# Patient Record
Sex: Female | Born: 1960 | Race: White | Hispanic: No | Marital: Married | Smoking: Never smoker
Health system: Southern US, Community
[De-identification: ages and names within clinical notes are randomized; demographics above are authoritative.]

## PROBLEM LIST (undated history)

## (undated) DIAGNOSIS — R7303 Prediabetes: Secondary | ICD-10-CM

## (undated) DIAGNOSIS — M1990 Unspecified osteoarthritis, unspecified site: Secondary | ICD-10-CM

## (undated) DIAGNOSIS — F329 Major depressive disorder, single episode, unspecified: Secondary | ICD-10-CM

## (undated) DIAGNOSIS — I10 Essential (primary) hypertension: Secondary | ICD-10-CM

## (undated) DIAGNOSIS — F32A Depression, unspecified: Secondary | ICD-10-CM

## (undated) DIAGNOSIS — R51 Headache: Secondary | ICD-10-CM

## (undated) HISTORY — PX: KNEE ARTHROSCOPY: SUR90

## (undated) HISTORY — PX: COLONOSCOPY: SHX174

---

## 1984-04-30 HISTORY — PX: CHOLECYSTECTOMY: SHX55

## 1999-02-16 ENCOUNTER — Other Ambulatory Visit: Admission: RE | Admit: 1999-02-16 | Discharge: 1999-02-16 | Payer: Self-pay | Admitting: *Deleted

## 2000-05-15 ENCOUNTER — Other Ambulatory Visit: Admission: RE | Admit: 2000-05-15 | Discharge: 2000-05-15 | Payer: Self-pay | Admitting: *Deleted

## 2001-06-10 ENCOUNTER — Other Ambulatory Visit: Admission: RE | Admit: 2001-06-10 | Discharge: 2001-06-10 | Payer: Self-pay | Admitting: *Deleted

## 2002-08-10 ENCOUNTER — Other Ambulatory Visit: Admission: RE | Admit: 2002-08-10 | Discharge: 2002-08-10 | Payer: Self-pay | Admitting: *Deleted

## 2003-06-11 ENCOUNTER — Ambulatory Visit (HOSPITAL_COMMUNITY): Admission: RE | Admit: 2003-06-11 | Discharge: 2003-06-11 | Payer: Self-pay | Admitting: Family Medicine

## 2003-06-29 ENCOUNTER — Other Ambulatory Visit: Admission: RE | Admit: 2003-06-29 | Discharge: 2003-06-29 | Payer: Self-pay | Admitting: *Deleted

## 2004-06-19 ENCOUNTER — Ambulatory Visit (HOSPITAL_COMMUNITY): Admission: RE | Admit: 2004-06-19 | Discharge: 2004-06-19 | Payer: Self-pay | Admitting: Family Medicine

## 2004-06-23 ENCOUNTER — Ambulatory Visit (HOSPITAL_COMMUNITY): Admission: RE | Admit: 2004-06-23 | Discharge: 2004-06-23 | Payer: Self-pay | Admitting: Family Medicine

## 2004-08-01 ENCOUNTER — Other Ambulatory Visit: Admission: RE | Admit: 2004-08-01 | Discharge: 2004-08-01 | Payer: Self-pay | Admitting: *Deleted

## 2007-08-15 ENCOUNTER — Emergency Department (HOSPITAL_COMMUNITY): Admission: EM | Admit: 2007-08-15 | Discharge: 2007-08-15 | Payer: Self-pay | Admitting: Emergency Medicine

## 2008-10-06 ENCOUNTER — Other Ambulatory Visit: Admission: RE | Admit: 2008-10-06 | Discharge: 2008-10-06 | Payer: Self-pay | Admitting: Obstetrics and Gynecology

## 2012-11-05 ENCOUNTER — Other Ambulatory Visit: Payer: Self-pay | Admitting: Orthopedic Surgery

## 2012-11-07 ENCOUNTER — Encounter (HOSPITAL_BASED_OUTPATIENT_CLINIC_OR_DEPARTMENT_OTHER): Payer: Self-pay | Admitting: *Deleted

## 2012-11-07 NOTE — Progress Notes (Signed)
To go to [id number] in a skilled nursing facility

## 2012-11-11 ENCOUNTER — Ambulatory Visit (HOSPITAL_COMMUNITY)
Admission: RE | Admit: 2012-11-11 | Discharge: 2012-11-12 | Disposition: A | Discharge status: Home / Self Care | Payer: PRIVATE HEALTH INSURANCE | Source: Ambulatory Visit | Attending: Orthopedic Surgery | Admitting: Orthopedic Surgery

## 2012-11-11 ENCOUNTER — Encounter (HOSPITAL_COMMUNITY)
Admission: RE | Admit: 2012-11-11 | Discharge: 2012-11-11 | Disposition: A | Discharge status: Home / Self Care | Payer: PRIVATE HEALTH INSURANCE | Source: Ambulatory Visit | Attending: Orthopedic Surgery | Admitting: Orthopedic Surgery

## 2012-11-11 DIAGNOSIS — F329 Major depressive disorder, single episode, unspecified: Secondary | ICD-10-CM | POA: Insufficient documentation

## 2012-11-11 DIAGNOSIS — I10 Essential (primary) hypertension: Secondary | ICD-10-CM | POA: Insufficient documentation

## 2012-11-11 DIAGNOSIS — M23349 Other meniscus derangements, anterior horn of lateral meniscus, unspecified knee: Secondary | ICD-10-CM | POA: Insufficient documentation

## 2012-11-11 DIAGNOSIS — M2240 Chondromalacia patellae, unspecified knee: Secondary | ICD-10-CM | POA: Insufficient documentation

## 2012-11-11 DIAGNOSIS — M23329 Other meniscus derangements, posterior horn of medial meniscus, unspecified knee: Secondary | ICD-10-CM | POA: Insufficient documentation

## 2012-11-11 DIAGNOSIS — Z79899 Other long term (current) drug therapy: Secondary | ICD-10-CM | POA: Insufficient documentation

## 2012-11-11 DIAGNOSIS — F3289 Other specified depressive episodes: Secondary | ICD-10-CM | POA: Insufficient documentation

## 2012-11-11 DIAGNOSIS — M129 Arthropathy, unspecified: Secondary | ICD-10-CM | POA: Insufficient documentation

## 2012-11-11 DIAGNOSIS — M2340 Loose body in knee, unspecified knee: Secondary | ICD-10-CM | POA: Insufficient documentation

## 2012-11-11 DIAGNOSIS — K219 Gastro-esophageal reflux disease without esophagitis: Secondary | ICD-10-CM | POA: Insufficient documentation

## 2012-11-11 HISTORY — DX: Headache: R51

## 2012-11-11 HISTORY — DX: Major depressive disorder, single episode, unspecified: F32.9

## 2012-11-11 HISTORY — DX: Unspecified osteoarthritis, unspecified site: M19.90

## 2012-11-11 HISTORY — DX: Depression, unspecified: F32.A

## 2012-11-11 HISTORY — DX: Essential (primary) hypertension: I10

## 2012-11-11 LAB — BASIC METABOLIC PANEL
CO2: 30 mEq/L (ref 19–32)
Chloride: 103 mEq/L (ref 96–112)
Sodium: 140 mEq/L (ref 135–145)

## 2012-11-12 ENCOUNTER — Encounter (HOSPITAL_BASED_OUTPATIENT_CLINIC_OR_DEPARTMENT_OTHER): Payer: Self-pay | Admitting: *Deleted

## 2012-11-12 ENCOUNTER — Encounter (HOSPITAL_BASED_OUTPATIENT_CLINIC_OR_DEPARTMENT_OTHER)
Admission: RE | Disposition: A | Discharge status: Home / Self Care | Payer: Self-pay | Source: Ambulatory Visit | Attending: Orthopedic Surgery

## 2012-11-12 ENCOUNTER — Ambulatory Visit (HOSPITAL_BASED_OUTPATIENT_CLINIC_OR_DEPARTMENT_OTHER): Payer: PRIVATE HEALTH INSURANCE | Admitting: *Deleted

## 2012-11-12 HISTORY — PX: KNEE ARTHROSCOPY: SHX127

## 2012-11-12 SURGERY — ARTHROSCOPY, KNEE
Anesthesia: General | Site: Knee | Laterality: Left | Wound class: Clean

## 2012-11-12 MED ORDER — ONDANSETRON HCL 4 MG/2ML IJ SOLN: 4.0000 mg | Freq: Once | INTRAMUSCULAR | Status: DC | PRN

## 2012-11-12 MED ORDER — OXYCODONE HCL 5 MG/5ML PO SOLN: 5.0000 mg | Freq: Once | ORAL | Status: AC | PRN

## 2012-11-12 MED ORDER — BUPIVACAINE HCL (PF) 0.5 % IJ SOLN
INTRAMUSCULAR | Status: DC | PRN
  Administered 2012-11-12: 20 mL

## 2012-11-12 MED ORDER — ONDANSETRON HCL 4 MG/2ML IJ SOLN
INTRAMUSCULAR | Status: DC | PRN
  Administered 2012-11-12: 4 mg via INTRAVENOUS

## 2012-11-12 MED ORDER — MEPERIDINE HCL 25 MG/ML IJ SOLN: 6.2500 mg | INTRAMUSCULAR | Status: DC | PRN

## 2012-11-12 MED ORDER — DEXAMETHASONE SODIUM PHOSPHATE 4 MG/ML IJ SOLN
INTRAMUSCULAR | Status: DC | PRN
  Administered 2012-11-12: 10 mg via INTRAVENOUS

## 2012-11-12 MED ORDER — EPINEPHRINE HCL 1 MG/ML IJ SOLN
INTRAMUSCULAR | Status: DC | PRN
  Administered 2012-11-12: 1 mg

## 2012-11-12 MED ORDER — EPHEDRINE SULFATE 50 MG/ML IJ SOLN
INTRAMUSCULAR | Status: DC | PRN
  Administered 2012-11-12: 10 mg via INTRAVENOUS

## 2012-11-12 MED ORDER — HYDROMORPHONE HCL PF 1 MG/ML IJ SOLN: 0.2500 mg | INTRAMUSCULAR | Status: DC | PRN

## 2012-11-12 MED ORDER — PROPOFOL 10 MG/ML IV BOLUS
INTRAVENOUS | Status: DC | PRN
  Administered 2012-11-12: 160 mg via INTRAVENOUS

## 2012-11-12 MED ORDER — POVIDONE-IODINE 7.5 % EX SOLN: Freq: Once | CUTANEOUS | Status: DC

## 2012-11-12 MED ORDER — OXYCODONE HCL 5 MG PO TABS
5.0000 mg | ORAL_TABLET | Freq: Once | ORAL | Status: AC | PRN
  Administered 2012-11-12: 5 mg via ORAL

## 2012-11-12 MED ORDER — LIDOCAINE HCL (CARDIAC) 20 MG/ML IV SOLN
INTRAVENOUS | Status: DC | PRN
  Administered 2012-11-12: 60 mg via INTRAVENOUS

## 2012-11-12 MED ORDER — FENTANYL CITRATE 0.05 MG/ML IJ SOLN
INTRAMUSCULAR | Status: DC | PRN
  Administered 2012-11-12: 25 ug via INTRAVENOUS
  Administered 2012-11-12: 75 ug via INTRAVENOUS

## 2012-11-12 MED ORDER — SODIUM CHLORIDE 0.9 % IR SOLN
Status: DC | PRN
  Administered 2012-11-12: 600 mL

## 2012-11-12 MED ORDER — OXYCODONE-ACETAMINOPHEN 5-325 MG PO TABS: 1.0000 | ORAL_TABLET | Freq: Four times a day (QID) | ORAL | Status: DC | PRN

## 2012-11-12 MED ORDER — MIDAZOLAM HCL 5 MG/5ML IJ SOLN
INTRAMUSCULAR | Status: DC | PRN
  Administered 2012-11-12: 2 mg via INTRAVENOUS

## 2012-11-12 MED ORDER — CEFAZOLIN SODIUM-DEXTROSE 2-3 GM-% IV SOLR
2.0000 g | INTRAVENOUS | Status: AC
  Administered 2012-11-12: 2 g via INTRAVENOUS

## 2012-11-12 MED ORDER — LACTATED RINGERS IV SOLN
INTRAVENOUS | Status: DC
  Administered 2012-11-12 (×2): via INTRAVENOUS

## 2012-11-12 SURGICAL SUPPLY — 40 items
BANDAGE ELASTIC 6 VELCRO ST LF (GAUZE/BANDAGES/DRESSINGS) ×2 IMPLANT
BLADE 4.2CUDA (BLADE) IMPLANT
BLADE GREAT WHITE 4.2 (BLADE) ×2 IMPLANT
CANISTER OMNI JUG 16 LITER (MISCELLANEOUS) ×2 IMPLANT
CANISTER SUCTION 2500CC (MISCELLANEOUS) IMPLANT
CLOTH BEACON ORANGE TIMEOUT ST (SAFETY) ×2 IMPLANT
CUTTER MENISCUS  4.2MM (BLADE)
CUTTER MENISCUS 4.2MM (BLADE) IMPLANT
DRAPE ARTHROSCOPY W/POUCH 114 (DRAPES) ×2 IMPLANT
DRSG EMULSION OIL 3X3 NADH (GAUZE/BANDAGES/DRESSINGS) ×2 IMPLANT
DURAPREP 26ML APPLICATOR (WOUND CARE) ×2 IMPLANT
ELECT MENISCUS 165MM 90D (ELECTRODE) IMPLANT
ELECT REM PT RETURN 9FT ADLT (ELECTROSURGICAL)
ELECTRODE REM PT RTRN 9FT ADLT (ELECTROSURGICAL) IMPLANT
GLOVE BIO SURGEON STRL SZ 6.5 (GLOVE) ×2 IMPLANT
GLOVE BIOGEL PI IND STRL 7.0 (GLOVE) ×1 IMPLANT
GLOVE BIOGEL PI IND STRL 8 (GLOVE) ×2 IMPLANT
GLOVE BIOGEL PI INDICATOR 7.0 (GLOVE) ×1
GLOVE BIOGEL PI INDICATOR 8 (GLOVE) ×2
GLOVE ECLIPSE 7.5 STRL STRAW (GLOVE) ×4 IMPLANT
GOWN BRE IMP PREV XXLGXLNG (GOWN DISPOSABLE) ×2 IMPLANT
GOWN PREVENTION PLUS XLARGE (GOWN DISPOSABLE) ×2 IMPLANT
GOWN PREVENTION PLUS XXLARGE (GOWN DISPOSABLE) ×2 IMPLANT
HOLDER KNEE FOAM BLUE (MISCELLANEOUS) ×2 IMPLANT
KNEE WRAP E Z 3 GEL PACK (MISCELLANEOUS) ×2 IMPLANT
NDL SAFETY ECLIPSE 18X1.5 (NEEDLE) IMPLANT
NEEDLE HYPO 18GX1.5 SHARP (NEEDLE)
PACK ARTHROSCOPY DSU (CUSTOM PROCEDURE TRAY) ×2 IMPLANT
PACK BASIN DAY SURGERY FS (CUSTOM PROCEDURE TRAY) ×2 IMPLANT
PAD CAST 4YDX4 CTTN HI CHSV (CAST SUPPLIES) ×1 IMPLANT
PADDING CAST COTTON 4X4 STRL (CAST SUPPLIES) ×1
PENCIL BUTTON HOLSTER BLD 10FT (ELECTRODE) IMPLANT
SET ARTHROSCOPY TUBING (MISCELLANEOUS) ×1
SET ARTHROSCOPY TUBING LN (MISCELLANEOUS) ×1 IMPLANT
SPONGE GAUZE 4X4 12PLY (GAUZE/BANDAGES/DRESSINGS) ×2 IMPLANT
SUT ETHILON 4 0 PS 2 18 (SUTURE) IMPLANT
SYR 5ML LL (SYRINGE) ×2 IMPLANT
TOWEL OR 17X24 6PK STRL BLUE (TOWEL DISPOSABLE) ×2 IMPLANT
TOWEL OR NON WOVEN STRL DISP B (DISPOSABLE) IMPLANT
WATER STERILE IRR 1000ML POUR (IV SOLUTION) ×2 IMPLANT

## 2012-11-12 NOTE — Transfer of Care (Signed)
Immediate Anesthesia Transfer of Care Note  Patient: Margaret Gonzalez  Procedure(s) Performed: Procedure(s) with comments: ARTHROSCOPY KNEE  (Left) - partial medial and lateral menisectomies and chrondroplasty of patella  Patient Location: PACU  Anesthesia Type:General  Level of Consciousness: awake, alert  and oriented  Airway & Oxygen Therapy: Patient Spontanous Breathing and Patient connected to face mask oxygen  Post-op Assessment: Report given to PACU RN, Post -op Vital signs reviewed and stable and Patient moving all extremities  Post vital signs: Reviewed and stable  Complications: No apparent anesthesia complications

## 2012-11-12 NOTE — Brief Op Note (Signed)
11/12/2012  12:51 PM  PATIENT:  Margaret Gonzalez  52 y.o. female  PRE-OPERATIVE DIAGNOSIS:  MEDIAL MENISCAL TEAR LEFT KNEE  POST-OPERATIVE DIAGNOSIS:  MEDIAL MENISCAL TEAR LEFT KNEE  PROCEDURE:  Procedure(s) with comments: ARTHROSCOPY KNEE  (Left) - partial medial and lateral menisectomies and chrondroplasty of patella  SURGEON:  Surgeon(s) and Role:    * Harvie Junior, MD - Primary  PHYSICIAN ASSISTANT:   ASSISTANTS: bethune   ANESTHESIA:   general  EBL:  Total I/O In: 1000 [I.V.:1000] Out: -   BLOOD ADMINISTERED:none  DRAINS: none   LOCAL MEDICATIONS USED:  MARCAINE     SPECIMEN:  No Specimen  DISPOSITION OF SPECIMEN:  N/A  COUNTS:  YES  TOURNIQUET:  * No tourniquets in log *  DICTATION: .Other Dictation: Dictation Number dictated but mised number  PLAN OF CARE: Discharge to home after PACU  PATIENT DISPOSITION:  PACU - hemodynamically stable.   Delay start of Pharmacological VTE agent (>24hrs) due to surgical blood loss or risk of bleeding: no

## 2012-11-12 NOTE — Anesthesia Postprocedure Evaluation (Signed)
Anesthesia Post Note  Patient: Margaret Gonzalez  Procedure(s) Performed: Procedure(s) (LRB): ARTHROSCOPY KNEE  (Left)  Anesthesia type: general  Patient location: PACU  Post pain: Pain level controlled  Post assessment: Patient's Cardiovascular Status Stable  Last Vitals:  Filed Vitals:   11/12/12 1358  BP: 156/88  Pulse: 67  Temp: 36.6 C  Resp: 16    Post vital signs: Reviewed and stable  Level of consciousness: sedated  Complications: No apparent anesthesia complications

## 2012-11-12 NOTE — Anesthesia Procedure Notes (Signed)
Procedure Name: LMA Insertion Date/Time: 11/12/2012 12:12 PM Performed by: Suann Larry WOLFE Pre-anesthesia Checklist: Patient identified, Emergency Drugs available, Suction available and Patient being monitored Patient Re-evaluated:Patient Re-evaluated prior to inductionOxygen Delivery Method: Circle System Utilized Preoxygenation: Pre-oxygenation with 100% oxygen Intubation Type: IV induction Ventilation: Mask ventilation without difficulty LMA: LMA inserted LMA Size: 4.0 Number of attempts: 1 Airway Equipment and Method: bite block Placement Confirmation: positive ETCO2 and breath sounds checked- equal and bilateral Tube secured with: Tape Dental Injury: Teeth and Oropharynx as per pre-operative assessment

## 2012-11-12 NOTE — Anesthesia Preprocedure Evaluation (Signed)
Anesthesia Evaluation  Patient identified by MRN, date of birth, ID band Patient awake    Reviewed: Allergy & Precautions, H&P , NPO status , Patient's Chart, lab work & pertinent test results  Airway Mallampati: I TM Distance: >3 FB Neck ROM: Full    Dental   Pulmonary          Cardiovascular hypertension, Pt. on medications     Neuro/Psych Depression    GI/Hepatic GERD-  Medicated and Controlled,  Endo/Other    Renal/GU      Musculoskeletal   Abdominal   Peds  Hematology   Anesthesia Other Findings   Reproductive/Obstetrics                           Anesthesia Physical Anesthesia Plan  ASA: II  Anesthesia Plan: General   Post-op Pain Management:    Induction: Intravenous  Airway Management Planned: LMA  Additional Equipment:   Intra-op Plan:   Post-operative Plan: Extubation in OR  Informed Consent: I have reviewed the patients History and Physical, chart, labs and discussed the procedure including the risks, benefits and alternatives for the proposed anesthesia with the patient or authorized representative who has indicated his/her understanding and acceptance.     Plan Discussed with: CRNA and Surgeon  Anesthesia Plan Comments:         Anesthesia Quick Evaluation

## 2012-11-12 NOTE — H&P (Signed)
  PREOPERATIVE H&P  Chief Complaint: l knee pain  HPI: Margaret Gonzalez is a 52 y.o. female who presents for evaluation of l knee pain. It has been present for greater than 3 months and has been worsening. She has failed conservative measures. Pain is rated as moderate.  Past Medical History  Diagnosis Date  . Hypertension   . Arthritis   . Headache(784.0)   . Depression    Past Surgical History  Procedure Laterality Date  . Cholecystectomy  1986  . Colonoscopy    . Cesarean section  [phone]    x3   History   Social History  . Marital Status: Married    Spouse Name: N/A    Number of Children: N/A  . Years of Education: N/A   Social History Main Topics  . Smoking status: Never Smoker   . Smokeless tobacco: None  . Alcohol Use: No  . Drug Use: No  . Sexually Active: None   Other Topics Concern  . None   Social History Narrative  . None   History reviewed. No pertinent family history. No Known Allergies Prior to Admission medications   Medication Sig Start Date End Date Taking? Authorizing Provider  citalopram (CELEXA) 40 MG tablet Take 40 mg by mouth daily.   Yes Historical Provider, MD  fish oil-omega-3 fatty acids 1000 MG capsule Take 2 g by mouth daily.   Yes Historical Provider, MD  ibuprofen (ADVIL,MOTRIN) 200 MG tablet Take 200 mg by mouth every 6 (six) hours as needed for pain.   Yes Historical Provider, MD  Multiple Vitamins-Minerals (MULTIVITAMIN WITH MINERALS) tablet Take 1 tablet by mouth daily.   Yes Historical Provider, MD  telmisartan-hydrochlorothiazide (MICARDIS HCT) 80-12.5 MG per tablet Take 1 tablet by mouth daily.   Yes Historical Provider, MD     Positive ROS: none  All other systems have been reviewed and were otherwise negative with the exception of those mentioned in the HPI and as above.  Physical Exam: Filed Vitals:   11/12/12 1130  BP: 142/90  Pulse:   Temp:   Resp:     General: Alert, no acute distress Cardiovascular: No  pedal edema Respiratory: No cyanosis, no use of accessory musculature GI: No organomegaly, abdomen is soft and non-tender Skin: No lesions in the area of chief complaint Neurologic: Sensation intact distally Psychiatric: Patient is competent for consent with normal mood and affect Lymphatic: No axillary or cervical lymphadenopathy  MUSCULOSKELETAL: L. Knee:  + mcmurray, -lochman,-pivot shift   Assessment/Plan: MEDIAL MENISCAL TEAR LEFT KNEE Plan for Procedure(s): ARTHROSCOPY KNEE  The risks benefits and alternatives were discussed with the patient including but not limited to the risks of nonoperative treatment, versus surgical intervention including infection, bleeding, nerve injury, malunion, nonunion, hardware prominence, hardware failure, need for hardware removal, blood clots, cardiopulmonary complications, morbidity, mortality, among others, and they were willing to proceed.  Predicted outcome is good, although there will be at least a six to nine month expected recovery.  Margaret Gonzalez L, MD 11/12/2012 11:31 AM

## 2012-11-13 ENCOUNTER — Encounter (HOSPITAL_BASED_OUTPATIENT_CLINIC_OR_DEPARTMENT_OTHER): Payer: Self-pay | Admitting: Orthopedic Surgery

## 2012-11-13 NOTE — Op Note (Signed)
NAMEKISSIE, ZIOLKOWSKI               ACCOUNT NO.:  [id number]  MEDICAL RECORD NO.:  [id number]  LOCATION:                               FACILITY:  [facility]  PHYSICIAN:  Harvie Junior, M.D.   DATE OF BIRTH:  09-29-1960  DATE OF PROCEDURE:  11/12/2012 DATE OF DISCHARGE:  11/12/2012                              OPERATIVE REPORT   PREOPERATIVE DIAGNOSIS:  Medial meniscal tear.  POSTOPERATIVE DIAGNOSES: 1. Medial meniscal tear. 2. Lateral meniscal tear, anterior horn. 3. Chondromalacia of patella. 4. Cartilaginous loose bodies.  PRINCIPAL PROCEDURES: 1. Arthroscopic medial and lateral partial meniscectomy with     corresponding debridement of medial and lateral compartments. 2. Chondroplasty of patellofemoral joint. 3. Removal of cartilaginous loose body.  SURGEON:  Harvie Junior, M.D.  ASSISTANT:  Marshia Ly, PA.  ANESTHESIA:  General.  BRIEF HISTORY:  Mrs. Parkison is a 52 year old female with a long history of having had significant complaints of left knee pain.  She had been treated conservatively for period of time.  MRI was obtained, which showed she had a posterior horn medial meniscal tear and tricompartmental DJD.  She after failure of all conservative care was taken to the operating room for arthroscopic evaluation.  PROCEDURE:  The patient was taken to the operating room.  After adequate level of anesthesia was obtained with general anesthesia, the patient was placed supine on the operating table.  The left leg was prepped and draped in usual sterile fashion.  Following this, a routine arthroscopic examination of the knee revealed there was obvious chondromalacia of the patellofemoral joint, which debrided back to a smooth and stable rim. Attention was then turned into the medial compartment where there was fairly significant degenerative change.  There was a posterior horn of medial meniscal tear, which was debrided.  ACL was normal.  Attention was turned into the  lateral compartment with anterior horn was torn and this was debrided back to a smooth and stable rim.  Lateral and femoral condyle were debrided minimally and once this was completed, attention was turned towards the medial side.  In the medial compartment, we encountered a large cartilaginous loose body, which was removed.  At this point, the knee was copiously and thoroughly lavaged and suctioned dry. The arthroscopic portal was closed with bandage.  Sterile compressive dressing was applied.  The patient was taken to the recovery room, she was noted to be in satisfactory condition.  Estimated blood loss for the procedure was none.     Harvie Junior, M.D.     Ranae Plumber  D:  11/12/2012  T:  11/13/2012  Job:  [id number]

## 2014-01-25 ENCOUNTER — Other Ambulatory Visit: Payer: Self-pay | Admitting: Gastroenterology

## 2014-01-25 DIAGNOSIS — R160 Hepatomegaly, not elsewhere classified: Secondary | ICD-10-CM

## 2014-02-02 ENCOUNTER — Ambulatory Visit
Admission: RE | Admit: 2014-02-02 | Discharge: 2014-02-02 | Disposition: A | Discharge status: Home / Self Care | Payer: PRIVATE HEALTH INSURANCE | Source: Ambulatory Visit | Attending: Gastroenterology | Admitting: Gastroenterology

## 2014-02-02 DIAGNOSIS — R160 Hepatomegaly, not elsewhere classified: Secondary | ICD-10-CM

## 2014-02-02 MED ORDER — GADOXETATE DISODIUM 0.25 MMOL/ML IV SOLN
10.0000 mL | Freq: Once | INTRAVENOUS | Status: AC | PRN
  Administered 2014-02-02: 10 mL via INTRAVENOUS

## 2014-02-08 ENCOUNTER — Other Ambulatory Visit (HOSPITAL_COMMUNITY): Payer: Self-pay | Admitting: Gastroenterology

## 2014-02-08 DIAGNOSIS — R160 Hepatomegaly, not elsewhere classified: Secondary | ICD-10-CM

## 2014-02-23 ENCOUNTER — Ambulatory Visit (HOSPITAL_COMMUNITY): Payer: PRIVATE HEALTH INSURANCE

## 2014-03-02 ENCOUNTER — Ambulatory Visit (HOSPITAL_COMMUNITY): Payer: PRIVATE HEALTH INSURANCE

## 2014-03-29 ENCOUNTER — Ambulatory Visit (HOSPITAL_COMMUNITY): Payer: PRIVATE HEALTH INSURANCE

## 2014-04-01 ENCOUNTER — Ambulatory Visit (HOSPITAL_COMMUNITY)
Admission: RE | Admit: 2014-04-01 | Discharge: 2014-04-01 | Disposition: A | Discharge status: Home / Self Care | Payer: PRIVATE HEALTH INSURANCE | Source: Ambulatory Visit | Attending: Gastroenterology | Admitting: Gastroenterology

## 2014-04-01 DIAGNOSIS — R160 Hepatomegaly, not elsewhere classified: Secondary | ICD-10-CM

## 2014-04-01 DIAGNOSIS — K760 Fatty (change of) liver, not elsewhere classified: Secondary | ICD-10-CM | POA: Insufficient documentation

## 2014-04-01 DIAGNOSIS — I10 Essential (primary) hypertension: Secondary | ICD-10-CM | POA: Insufficient documentation

## 2014-04-01 DIAGNOSIS — Z9049 Acquired absence of other specified parts of digestive tract: Secondary | ICD-10-CM | POA: Diagnosis not present

## 2014-04-14 ENCOUNTER — Ambulatory Visit
Admission: RE | Admit: 2014-04-14 | Discharge: 2014-04-14 | Disposition: A | Discharge status: Home / Self Care | Payer: PRIVATE HEALTH INSURANCE | Source: Ambulatory Visit | Attending: Family Medicine | Admitting: Family Medicine

## 2014-04-14 ENCOUNTER — Other Ambulatory Visit: Payer: Self-pay | Admitting: Family Medicine

## 2014-04-14 DIAGNOSIS — R1031 Right lower quadrant pain: Secondary | ICD-10-CM

## 2014-04-14 MED ORDER — IOHEXOL 300 MG/ML  SOLN
125.0000 mL | Freq: Once | INTRAMUSCULAR | Status: AC | PRN
  Administered 2014-04-14: 125 mL via INTRAVENOUS

## 2015-01-04 ENCOUNTER — Other Ambulatory Visit: Payer: Self-pay | Admitting: Family Medicine

## 2015-01-04 ENCOUNTER — Ambulatory Visit
Admission: RE | Admit: 2015-01-04 | Discharge: 2015-01-04 | Disposition: A | Discharge status: Home / Self Care | Payer: PRIVATE HEALTH INSURANCE | Source: Ambulatory Visit | Attending: Family Medicine | Admitting: Family Medicine

## 2015-01-04 DIAGNOSIS — M545 Low back pain: Secondary | ICD-10-CM

## 2015-04-04 ENCOUNTER — Other Ambulatory Visit: Payer: Self-pay | Admitting: Obstetrics and Gynecology

## 2015-04-04 ENCOUNTER — Other Ambulatory Visit (HOSPITAL_COMMUNITY)
Admission: RE | Admit: 2015-04-04 | Discharge: 2015-04-04 | Disposition: A | Discharge status: Home / Self Care | Payer: PRIVATE HEALTH INSURANCE | Source: Ambulatory Visit | Attending: Obstetrics and Gynecology | Admitting: Obstetrics and Gynecology

## 2015-04-04 DIAGNOSIS — Z1151 Encounter for screening for human papillomavirus (HPV): Secondary | ICD-10-CM | POA: Diagnosis present

## 2015-04-04 DIAGNOSIS — Z01419 Encounter for gynecological examination (general) (routine) without abnormal findings: Secondary | ICD-10-CM | POA: Insufficient documentation

## 2015-04-06 LAB — CYTOLOGY - PAP

## 2016-05-28 DIAGNOSIS — R7301 Impaired fasting glucose: Secondary | ICD-10-CM | POA: Diagnosis not present

## 2016-05-28 DIAGNOSIS — I10 Essential (primary) hypertension: Secondary | ICD-10-CM | POA: Diagnosis not present

## 2016-05-28 DIAGNOSIS — E7800 Pure hypercholesterolemia, unspecified: Secondary | ICD-10-CM | POA: Diagnosis not present

## 2016-07-11 ENCOUNTER — Encounter: Payer: Self-pay | Admitting: Podiatry

## 2016-07-11 ENCOUNTER — Ambulatory Visit (INDEPENDENT_AMBULATORY_CARE_PROVIDER_SITE_OTHER): Payer: 59 | Admitting: Podiatry

## 2016-07-11 ENCOUNTER — Ambulatory Visit (INDEPENDENT_AMBULATORY_CARE_PROVIDER_SITE_OTHER): Payer: 59

## 2016-07-11 DIAGNOSIS — M722 Plantar fascial fibromatosis: Secondary | ICD-10-CM

## 2016-07-11 MED ORDER — TRIAMCINOLONE ACETONIDE 10 MG/ML IJ SUSP
10.0000 mg | Freq: Once | INTRAMUSCULAR | Status: AC
  Administered 2016-07-11: 10 mg

## 2016-07-11 MED ORDER — DICLOFENAC SODIUM 75 MG PO TBEC: 75.0000 mg | DELAYED_RELEASE_TABLET | Freq: Two times a day (BID) | ORAL | 2 refills | Status: DC

## 2016-07-11 NOTE — Progress Notes (Signed)
   Subjective:    Patient ID: Margaret Gonzalez, female    DOB: 07/04/1960, 56 y.o.   MRN: [id number]  HPI Chief Complaint  Patient presents with  . Foot Pain    Left foot; bottom of heel; pt stated, "Hurts all day long"; x2 weeks   Pt stated, "Pain radiates up the calf"   Review of Systems  Musculoskeletal: Positive for gait problem.  All other systems reviewed and are negative.      Objective:   Physical Exam        Assessment & Plan:

## 2016-07-11 NOTE — Patient Instructions (Signed)

## 2016-07-12 NOTE — Progress Notes (Signed)
Subjective:     Patient ID: Margaret Gonzalez, female   DOB: 12/20/1960, 56 y.o.   MRN: [id number]  HPI patient presents with exquisite discomfort plantar aspect left heel at the insertional point of the tendon into the calcaneus with inflammation fluid buildup around the medial band states it's only been present for around a month   Review of Systems  All other systems reviewed and are negative.      Objective:   Physical Exam  Constitutional: She is oriented to person, place, and time.  Cardiovascular: Intact distal pulses.   Musculoskeletal: Normal range of motion.  Neurological: She is oriented to person, place, and time.  Skin: Skin is warm.  Nursing note and vitals reviewed.  neurovascular status intact muscle strength adequate range of motion within normal limits with patient found to have exquisite discomfort left plantar heel at the insertional point of the tendon into the calcaneus with fluid buildup. Patient does have moderate depression of the arch and good digital perfusion and well oriented 3     Assessment:     Inflammatory fasciitis left heel at the insertional point of the tendon into the calcaneus    Plan:     H&P x-rays reviewed and injected the plantar fascial left 3 mg Kenalog 5 mg Xylocaine and instructed on physical therapy and dispensed fascial brace. Discussed long-term orthotics and placed on anti-inflammatory currently and will see back again in 2 weeks  X-ray indicated small spur with no indications of stress fracture or arthritis

## 2016-07-26 ENCOUNTER — Ambulatory Visit: Payer: 59 | Admitting: Podiatry

## 2016-08-06 ENCOUNTER — Ambulatory Visit (INDEPENDENT_AMBULATORY_CARE_PROVIDER_SITE_OTHER): Payer: 59 | Admitting: Podiatry

## 2016-08-06 DIAGNOSIS — M722 Plantar fascial fibromatosis: Secondary | ICD-10-CM | POA: Diagnosis not present

## 2016-08-06 MED ORDER — TRIAMCINOLONE ACETONIDE 10 MG/ML IJ SUSP
10.0000 mg | Freq: Once | INTRAMUSCULAR | Status: AC
  Administered 2016-08-06: 10 mg

## 2016-08-06 NOTE — Progress Notes (Signed)
Subjective:     Patient ID: Margaret Gonzalez, female   DOB: [birth year], 56 y.o.   MRN: [id number]  HPI patient states she still having quite a bit of pain in the left heel and is had history of right heel pain with pain worse when getting up in the morning and after sitting   Review of Systems     Objective:   Physical Exam Neurovascular status intact with continued significant discomfort in the left plantar fashion at the insertion with pain worse after periods of sitting and when getting up in the morning    Assessment:     Plantar fasciitis left heel intense in nature    Plan:     H&P condition reviewed and at this point I reinjected the plantar fascia 3 mg Kenalog 5 mg Xylocaine and went ahead and applied night splint with all instructions on usage. I then scanned for custom orthotic devices to reduce long-term pain and help with significant structural malalignment

## 2016-08-08 DIAGNOSIS — M25561 Pain in right knee: Secondary | ICD-10-CM | POA: Diagnosis not present

## 2016-08-13 DIAGNOSIS — M25561 Pain in right knee: Secondary | ICD-10-CM | POA: Diagnosis not present

## 2016-08-21 ENCOUNTER — Telehealth: Payer: Self-pay | Admitting: *Deleted

## 2016-08-21 NOTE — Telephone Encounter (Signed)
Orthotics are in.  Left message for patient to call and schedule an appointment to be fitted.

## 2016-09-17 DIAGNOSIS — M25561 Pain in right knee: Secondary | ICD-10-CM | POA: Diagnosis not present

## 2016-09-28 DIAGNOSIS — M25561 Pain in right knee: Secondary | ICD-10-CM | POA: Diagnosis not present

## 2016-10-08 DIAGNOSIS — I10 Essential (primary) hypertension: Secondary | ICD-10-CM | POA: Diagnosis not present

## 2016-10-08 DIAGNOSIS — E7800 Pure hypercholesterolemia, unspecified: Secondary | ICD-10-CM | POA: Diagnosis not present

## 2016-10-11 ENCOUNTER — Ambulatory Visit: Payer: 59 | Admitting: Orthotics

## 2016-10-15 DIAGNOSIS — M23221 Derangement of posterior horn of medial meniscus due to old tear or injury, right knee: Secondary | ICD-10-CM | POA: Diagnosis not present

## 2016-10-15 DIAGNOSIS — M23241 Derangement of anterior horn of lateral meniscus due to old tear or injury, right knee: Secondary | ICD-10-CM | POA: Diagnosis not present

## 2016-10-15 DIAGNOSIS — M94261 Chondromalacia, right knee: Secondary | ICD-10-CM | POA: Diagnosis not present

## 2016-10-15 DIAGNOSIS — M6751 Plica syndrome, right knee: Secondary | ICD-10-CM | POA: Diagnosis not present

## 2016-11-20 ENCOUNTER — Other Ambulatory Visit: Payer: 59 | Admitting: Orthotics

## 2016-11-20 DIAGNOSIS — M25561 Pain in right knee: Secondary | ICD-10-CM | POA: Diagnosis not present

## 2017-02-05 DIAGNOSIS — M25561 Pain in right knee: Secondary | ICD-10-CM | POA: Diagnosis not present

## 2017-05-14 DIAGNOSIS — M1711 Unilateral primary osteoarthritis, right knee: Secondary | ICD-10-CM | POA: Diagnosis not present

## 2017-05-17 DIAGNOSIS — R7303 Prediabetes: Secondary | ICD-10-CM | POA: Diagnosis not present

## 2017-05-17 DIAGNOSIS — E7800 Pure hypercholesterolemia, unspecified: Secondary | ICD-10-CM | POA: Diagnosis not present

## 2017-05-17 DIAGNOSIS — I10 Essential (primary) hypertension: Secondary | ICD-10-CM | POA: Diagnosis not present

## 2017-05-17 DIAGNOSIS — R0981 Nasal congestion: Secondary | ICD-10-CM | POA: Diagnosis not present

## 2019-05-22 ENCOUNTER — Other Ambulatory Visit: Payer: Self-pay

## 2019-05-22 ENCOUNTER — Ambulatory Visit: Payer: PRIVATE HEALTH INSURANCE | Attending: Internal Medicine

## 2019-05-22 DIAGNOSIS — Z20822 Contact with and (suspected) exposure to covid-19: Secondary | ICD-10-CM

## 2019-05-23 LAB — NOVEL CORONAVIRUS, NAA: SARS-CoV-2, NAA: NOT DETECTED

## 2020-06-21 ENCOUNTER — Emergency Department (HOSPITAL_COMMUNITY): Payer: PRIVATE HEALTH INSURANCE

## 2020-06-21 ENCOUNTER — Encounter (HOSPITAL_COMMUNITY): Payer: Self-pay | Admitting: Emergency Medicine

## 2020-06-21 ENCOUNTER — Other Ambulatory Visit: Payer: Self-pay

## 2020-06-21 ENCOUNTER — Emergency Department (HOSPITAL_COMMUNITY)
Admission: EM | Admit: 2020-06-21 | Discharge: 2020-06-21 | Disposition: A | Discharge status: Home / Self Care | Payer: PRIVATE HEALTH INSURANCE | Attending: Emergency Medicine | Admitting: Emergency Medicine

## 2020-06-21 DIAGNOSIS — Z79899 Other long term (current) drug therapy: Secondary | ICD-10-CM | POA: Insufficient documentation

## 2020-06-21 DIAGNOSIS — W19XXXA Unspecified fall, initial encounter: Secondary | ICD-10-CM

## 2020-06-21 DIAGNOSIS — Z7982 Long term (current) use of aspirin: Secondary | ICD-10-CM | POA: Insufficient documentation

## 2020-06-21 DIAGNOSIS — M79602 Pain in left arm: Secondary | ICD-10-CM

## 2020-06-21 DIAGNOSIS — S42322A Displaced transverse fracture of shaft of humerus, left arm, initial encounter for closed fracture: Secondary | ICD-10-CM | POA: Insufficient documentation

## 2020-06-21 DIAGNOSIS — W08XXXA Fall from other furniture, initial encounter: Secondary | ICD-10-CM | POA: Insufficient documentation

## 2020-06-21 DIAGNOSIS — I10 Essential (primary) hypertension: Secondary | ICD-10-CM | POA: Insufficient documentation

## 2020-06-21 LAB — BASIC METABOLIC PANEL
Anion gap: 9 (ref 5–15)
BUN: 16 mg/dL (ref 6–20)
CO2: 27 mmol/L (ref 22–32)
Calcium: 8.8 mg/dL — ABNORMAL LOW (ref 8.9–10.3)
Chloride: 99 mmol/L (ref 98–111)
Creatinine, Ser: 0.67 mg/dL (ref 0.44–1.00)
GFR, Estimated: >60 mL/min (ref >60)
Glucose, Bld: 149 mg/dL — ABNORMAL HIGH (ref 70–99)
Potassium: 3.4 mmol/L — ABNORMAL LOW (ref 3.5–5.1)
Sodium: 135 mmol/L (ref 135–145)

## 2020-06-21 LAB — CBC
HCT: 41.4 % (ref 36.0–46.0)
Hemoglobin: 13.5 g/dL (ref 12.0–15.0)
MCH: 29.2 pg (ref 26.0–34.0)
MCHC: 32.6 g/dL (ref 30.0–36.0)
MCV: 89.6 fL (ref 80.0–100.0)
Platelets: 289 10*3/uL (ref 150–400)
RBC: 4.62 MIL/uL (ref 3.87–5.11)
RDW: 13.7 % (ref 11.5–15.5)
WBC: 12.3 10*3/uL — ABNORMAL HIGH (ref 4.0–10.5)
nRBC: 0 % (ref 0.0–0.2)

## 2020-06-21 MED ORDER — ONDANSETRON 4 MG PO TBDP: 4.0000 mg | ORAL_TABLET | Freq: Three times a day (TID) | ORAL | 0 refills | Status: AC | PRN

## 2020-06-21 MED ORDER — LORAZEPAM 1 MG PO TABS
1.0000 mg | ORAL_TABLET | Freq: Once | ORAL | Status: AC
  Administered 2020-06-21: 1 mg via ORAL
  Filled 2020-06-21: qty 1

## 2020-06-21 MED ORDER — HYDROMORPHONE HCL 1 MG/ML IJ SOLN: 0.5000 mg | Freq: Once | INTRAMUSCULAR | Status: DC

## 2020-06-21 MED ORDER — ONDANSETRON 4 MG PO TBDP: 4.0000 mg | ORAL_TABLET | Freq: Three times a day (TID) | ORAL | 0 refills | Status: DC | PRN

## 2020-06-21 MED ORDER — ONDANSETRON HCL 4 MG/2ML IJ SOLN
4.0000 mg | Freq: Once | INTRAMUSCULAR | Status: AC
  Administered 2020-06-21: 4 mg via INTRAVENOUS
  Filled 2020-06-21: qty 2

## 2020-06-21 MED ORDER — DIPHENHYDRAMINE HCL 50 MG/ML IJ SOLN
12.5000 mg | Freq: Once | INTRAMUSCULAR | Status: AC
  Administered 2020-06-21: 12.5 mg via INTRAVENOUS
  Filled 2020-06-21: qty 1

## 2020-06-21 MED ORDER — METHOCARBAMOL 500 MG PO TABS: 500.0000 mg | ORAL_TABLET | Freq: Two times a day (BID) | ORAL | 0 refills | Status: DC

## 2020-06-21 MED ORDER — FENTANYL CITRATE (PF) 100 MCG/2ML IJ SOLN: 100.0000 ug | INTRAMUSCULAR | Status: DC | PRN

## 2020-06-21 MED ORDER — HYDROCODONE-ACETAMINOPHEN 5-325 MG PO TABS: 1.0000 | ORAL_TABLET | Freq: Four times a day (QID) | ORAL | 0 refills | Status: DC | PRN

## 2020-06-21 MED ORDER — HYDROMORPHONE HCL 1 MG/ML IJ SOLN
0.5000 mg | Freq: Once | INTRAMUSCULAR | Status: AC
  Administered 2020-06-21: 0.5 mg via INTRAVENOUS
  Filled 2020-06-21: qty 1

## 2020-06-21 MED ORDER — HYDROCODONE-ACETAMINOPHEN 5-325 MG PO TABS
1.0000 | ORAL_TABLET | Freq: Once | ORAL | Status: AC
  Administered 2020-06-21: 1 via ORAL
  Filled 2020-06-21: qty 1

## 2020-06-21 NOTE — ED Notes (Signed)
Patient transported to X-ray 

## 2020-06-21 NOTE — ED Notes (Signed)
Entered room and introduced self to patient. Pt appears to be resting in bed, respirations are even and unlabored with equal chest rise and fall. Bed is locked in the lowest position, side rails x2, call bell within reach. Pt educated on call light use and hourly rounding, verbalized understanding and in agreement at this time. All questions and concerns voiced addressed. Refreshments offered and provided per patient request.  Will continue to monitor.   

## 2020-06-21 NOTE — ED Provider Notes (Signed)
[facility] EMERGENCY DEPARTMENT Provider Note   CSN: [id number] Arrival date & time: 06/21/20  1350     History Chief Complaint  Patient presents with  . Fall    Margaret Gonzalez is a 60 y.o. female.  HPI Patient is a 60 year old female with no pertinent past medical history apart from depression, hypertension, borderline diabetes  Patient presented today after a fall that occurred approximately 1.5 hours ago.  She states that she was standing on a barstool fell backwards because she lost her balance and fell to the ground.  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  She states that she did hit her head and fell onto her left shoulder.  She states that she immediately knew something was wrong of her arm.  She states she did not lose consciousness and states that she denies any nausea until she had fentanyl via EMS.  She states that she has not had any vomiting.  She denies any significant headache.  She denies any back pain chest pain or abdominal pain.  She states she still has sensation in her left arm.  She states that she is able to move her fingers but cannot move her arm because of the pain.  She has no history of osteoporosis or cancer.  No other associate symptoms.  No aggravating factors apart from significantly worse with touch or movement.  She received 100 of fentanyl and 4 of Zofran prior to arrival via EMS.  She is not on any blood thinners.    Past Medical History:  Diagnosis Date  . Arthritis   . Depression   . Headache(784.0)   . Hypertension     There are no problems to display for this patient.   Past Surgical History:  Procedure Laterality Date  . CESAREAN SECTION  [phone]   x3  . CHOLECYSTECTOMY  1986  . COLONOSCOPY    . KNEE ARTHROSCOPY Left 11/12/2012   Procedure: ARTHROSCOPY KNEE ;  Surgeon: Harvie Junior, MD;  Location: [facility];  Service: Orthopedics;  Laterality: Left;  partial medial and lateral menisectomies and  chrondroplasty of patella     OB History   No obstetric history on file.     History reviewed. No pertinent family history.  Social History   Tobacco Use  . Smoking status: Never Smoker  . Smokeless tobacco: Never Used  Substance Use Topics  . Alcohol use: No  . Drug use: No    Home Medications Prior to Admission medications   Medication Sig Start Date End Date Taking? Authorizing Provider  albuterol (VENTOLIN HFA) 108 (90 Base) MCG/ACT inhaler Inhale 1 puff into the lungs every 6 (six) hours as needed for wheezing or shortness of breath. 05/16/18  Yes [provider]  amLODipine (NORVASC) 10 MG tablet Take 10 mg by mouth daily. 05/27/20  Yes [provider]  aspirin EC 81 MG tablet Take 81 mg by mouth daily. Swallow whole.   Yes [provider]  B Complex Vitamins (VITAMIN B COMPLEX) TABS 1 tablet daily.   Yes [provider]  buPROPion (WELLBUTRIN XL) 150 MG 24 hr tablet Take 150 mg by mouth daily. 03/28/20  Yes [provider]  citalopram (CELEXA) 40 MG tablet Take 20 mg by mouth daily.    Yes [provider]  fish oil-omega-3 fatty acids 1000 MG capsule Take 2 g by mouth daily.   Yes [provider]  HYDROcodone-acetaminophen (NORCO/VICODIN) 5-325 MG tablet Take 1  tablet by mouth every 6 (six) hours as needed for severe pain. 06/21/20  Yes Fondaw, Wylder S, PA  losartan-hydrochlorothiazide (HYZAAR) 100-25 MG tablet Take 1 tablet by mouth daily.   Yes [provider]  methocarbamol (ROBAXIN) 500 MG tablet Take 1 tablet (500 mg total) by mouth 2 (two) times daily. 06/21/20  Yes Fondaw, Stevphen MeuseWylder S, PA  Multiple Vitamins-Minerals (VITAMIN D3 COMPLETE) TABS Take 1 tablet by mouth daily.   Yes [provider]  omeprazole (PRILOSEC) 20 MG capsule Take 20 mg by mouth daily.   Yes [provider]  Red Yeast Rice 600 MG CAPS Take 2 capsules by mouth daily.   Yes [provider]  diclofenac  (VOLTAREN) 75 MG EC tablet Take 1 tablet (75 mg total) by mouth 2 (two) times daily. Patient not taking: No sig reported 07/11/16   Lenn Sinkegal, Norman S, DPM  diphenhydrAMINE (BENADRYL) 25 mg capsule Take 1 capsule (25 mg total) by mouth every 4 (four) hours as needed. 06/22/20   Vickki HearingHarrison, Stanley E, MD  ondansetron (ZOFRAN ODT) 4 MG disintegrating tablet Take 1 tablet (4 mg total) by mouth every 8 (eight) hours as needed for nausea or vomiting. 06/21/20   Gailen ShelterFondaw, Wylder S, PA  oxyCODONE-acetaminophen (PERCOCET/ROXICET) 5-325 MG tablet Take 1 tablet by mouth every 6 (six) hours as needed for up to 7 days for severe pain. 06/22/20 06/29/20  Vickki HearingHarrison, Stanley E, MD    Allergies    Codeine and Oxycodone  Review of Systems   Review of Systems  Constitutional: Negative for fever.  HENT: Negative for congestion.   Respiratory: Negative for shortness of breath.   Cardiovascular: Negative for chest pain.  Gastrointestinal: Negative for abdominal distention.  Musculoskeletal:       Left shoulder and upper arm pain.  Neurological: Negative for dizziness and headaches.    Physical Exam Updated Vital Signs BP 113/66   Pulse 71   Temp 97.6 F (36.4 C) (Oral)   Resp 14   Ht 5\' 5"  (1.651 m)   Wt 90.7 kg   SpO2 97%   BMI 33.28 kg/m   Physical Exam Vitals and nursing note reviewed.  Constitutional:      General: She is not in acute distress.    Appearance: Normal appearance. She is not ill-appearing.  HENT:     Head: Normocephalic and atraumatic.  Eyes:     General: No scleral icterus.       Right eye: No discharge.        Left eye: No discharge.     Conjunctiva/sclera: Conjunctivae normal.  Cardiovascular:     Rate and Rhythm: Normal rate and regular rhythm.     Comments: Radial artery pulses are 3+ and symmetric Pulmonary:     Effort: Pulmonary effort is normal.     Breath sounds: No stridor.  Musculoskeletal:     Comments: Sulcus sign positive on LEFT shoulder.  Also with tenderness to  palpation of the humerus there does appear to be some bony deformity here.  No significant bruising no lacerations or open wounds or abrasions.  Bilateral radial artery pulses are 3+ and symmetric.  Patient is able to wiggle fingers of both hands and has sensation in the C6, C7, C8 distribution also has the ability to dorsiflex her left wrist  No other bony tenderness over joints or long bones of the upper and lower extremities.     No neck or back midline tenderness, step-off, deformity, or bruising. Able to turn head  left and right 45 degrees without difficulty.  Full range of motion of upper and lower extremity joints (APART FROM LEFT UPPER EXTREMITY) shown after palpation was conducted; with 5/5 symmetrical strength in upper and lower extremities. No chest wall tenderness, no facial or cranial tenderness.   Patient has intact sensation grossly in lower and upper extremities. Intact patellar and ankle reflexes. Patient able to ambulate without difficulty.  Radial and DP pulses palpated BL.    Skin:    General: Skin is warm and dry.     Capillary Refill: Capillary refill takes less than 2 seconds.  Neurological:     Mental Status: She is alert and oriented to person, place, and time. Mental status is at baseline.     ED Results / Procedures / Treatments   Labs (all labs ordered are listed, but only abnormal results are displayed) Labs Reviewed  BASIC METABOLIC PANEL - Abnormal; Notable for the following components:      Result Value   Potassium 3.4 (*)    Glucose, Bld 149 (*)    Calcium 8.8 (*)    All other components within normal limits  CBC - Abnormal; Notable for the following components:   WBC 12.3 (*)    All other components within normal limits    EKG None  Radiology No results found.  Procedures Procedures   Medications Ordered in ED Medications  diphenhydrAMINE (BENADRYL) injection 12.5 mg (12.5 mg Intravenous Given 06/21/20 1440)  ondansetron (ZOFRAN)  injection 4 mg (4 mg Intravenous Given 06/21/20 1440)  HYDROmorphone (DILAUDID) injection 0.5 mg (0.5 mg Intravenous Given 06/21/20 1440)  HYDROcodone-acetaminophen (NORCO/VICODIN) 5-325 MG per tablet 1 tablet (1 tablet Oral Given 06/21/20 1807)  LORazepam (ATIVAN) tablet 1 mg (1 mg Oral Given 06/21/20 1941)  ondansetron (ZOFRAN) injection 4 mg (4 mg Intravenous Given 06/21/20 1941)    ED Course  I have reviewed the triage vital signs and the nursing notes.  Pertinent labs & imaging results that were available during my care of the patient were reviewed by me and considered in my medical decision making (see chart for details).  Patient is 60 year old female here after mechanical fall.  States she is not sure if she had in the head or not but denies any headache, loss of consciousness, nausea, vomiting or any significant pain apart from left shoulder pain/arm pain.  On physical exam arm does appear deformed.  Concern for humeral fracture versus shoulder dislocation suspect the former.  Bilateral pulses are 3+ and symmetric radial artery pulses.  No wrist drop.  Sensation intact  Clinical Course as of 06/24/20 0807  Tue Jun 21, 2020  1514 X-ray shows midshaft slightly proximal humerus fracture.  Is a transverse fracture.  It is minimally displaced but significantly angulated.  Shoulder appears to be within the glenoid. [WF]  1534 Consult placed to orthopedic surgery [WF]  1604  I personally reviewed all laboratory work and imaging.  Metabolic panel without any acute abnormality specifically kidney function within normal limits and no significant electrolyte abnormalities. CBC without significant leukocytosis (mild WBC elevat likely 2/2 physiologic stress) without anemia.  [WF]  1604 IMPRESSION: Acute displaced and angulated fracture involving midshaft of the humerus.   [WF]  1750 Discussed with Dr. Romeo Apple of orthopedics.  He recommends coaptation splint and sling.  Follow-up in clinic.  [WF]  1750 Patient is understanding of and agreeable to plan.  She will follow-up with Dr. Romeo Apple.  Placed in splint.  Patient given Norco for pain control  at home.   [WF]    Clinical Course User Index [WF] Gailen Shelter, [place]   MDM Rules/Calculators/A&P                          Patient discharged with analgesia in the form of Norco, Robaxin for muscle spasms, Zofran for nausea, Tylenol and ibuprofen recommendations were made with specific details about Tylenol dosage to prevent overdosing on Tylenol.  She is understanding of plan, she has good insight into her case and is able to teach back specifics.  Patient placed in splint however this did not have adequate coverage and did not extend up to the shoulder.  I discussed this with nurse and gave specific details..  Oncoming PA will reassess to ensure that splint is correct fit.    Final Clinical Impression(s) / ED Diagnoses Final diagnoses:  Fall, initial encounter  Arm pain, left  Closed displaced transverse fracture of shaft of left humerus, initial encounter    Rx / DC Orders ED Discharge Orders         Ordered    HYDROcodone-acetaminophen (NORCO/VICODIN) 5-325 MG tablet  Every 6 hours PRN        06/21/20 1722    methocarbamol (ROBAXIN) 500 MG tablet  2 times daily        06/21/20 1809    ondansetron (ZOFRAN ODT) 4 MG disintegrating tablet  Every 8 hours PRN,   Status:  Discontinued        06/21/20 1822    ondansetron (ZOFRAN ODT) 4 MG disintegrating tablet  Every 8 hours PRN        06/21/20 1822           Solon Augusta Bolivar, [place] 06/24/20 0809    Bethann Berkshire, MD 06/24/20 [phone]

## 2020-06-21 NOTE — ED Notes (Signed)
PA made aware of splint placement and will be to bedside for splint assessment.  This RN in agreement a this time.

## 2020-06-21 NOTE — ED Notes (Signed)
Screening desk made aware that patient has been cleared for a visitor.

## 2020-06-21 NOTE — Discharge Instructions (Addendum)
You have a fracture of your humerus.  Please keep the splint on until you are evaluated by orthopedics.  Please wear the sling for comfort.  This will also help keep your arm from moving.    Please use Tylenol or ibuprofen for pain.  You may use 600 mg ibuprofen every 6 hours or 1000 mg of Tylenol every 6 hours.  You may choose to alternate between the 2.  This would be most effective.  Not to exceed 4 g of Tylenol within 24 hours.  Not to exceed 3200 mg ibuprofen 24 hours.  I also prescribed a muscle relaxer called Robaxin you may use this as needed for pain control.  I have also given you Norco which is a narcotic pain medicine which you may use primarily at bedtime for pain.

## 2020-06-21 NOTE — ED Triage Notes (Signed)
Pt was standing in chair. Fell on her left shoulder. Shoulder and arm injury noted  100 mcg of fentanyl and 4 mg of zofran.

## 2020-06-21 NOTE — ED Notes (Signed)
Lab at bedside

## 2020-06-22 ENCOUNTER — Ambulatory Visit (INDEPENDENT_AMBULATORY_CARE_PROVIDER_SITE_OTHER): Payer: Self-pay | Admitting: Orthopedic Surgery

## 2020-06-22 VITALS — BP 135/81 | HR 81 | Ht 65.0 in | Wt 200.0 lb

## 2020-06-22 DIAGNOSIS — W07XXXA Fall from chair, initial encounter: Secondary | ICD-10-CM

## 2020-06-22 DIAGNOSIS — S42322A Displaced transverse fracture of shaft of humerus, left arm, initial encounter for closed fracture: Secondary | ICD-10-CM

## 2020-06-22 MED ORDER — DIPHENHYDRAMINE HCL 25 MG PO CAPS: 25.0000 mg | ORAL_CAPSULE | ORAL | 2 refills | Status: AC | PRN

## 2020-06-22 MED ORDER — OXYCODONE-ACETAMINOPHEN 5-325 MG PO TABS: 1.0000 | ORAL_TABLET | Freq: Four times a day (QID) | ORAL | 0 refills | Status: AC | PRN

## 2020-06-22 NOTE — Progress Notes (Signed)
beNEW PROBLEM//OFFICE VISIT  Summary assessment and plan:   Right-hand-dominant left midshaft humerus meets criteria for nonoperative treatment placed in new coaptation splint x-ray in 2 weeks convert to fracture cuff  Chief Complaint  Patient presents with  . 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    Past Medical History:  Diagnosis Date  . Arthritis   . Depression   . Headache(784.0)   . Hypertension     Past Surgical History:  Procedure Laterality Date  . CESAREAN SECTION  [phone]   x3  . CHOLECYSTECTOMY  1986  . COLONOSCOPY    . KNEE ARTHROSCOPY Left 11/12/2012   Procedure: ARTHROSCOPY KNEE ;  Surgeon: Harvie Junior, MD;  Location: [facility];  Service: Orthopedics;  Laterality: Left;  partial medial and lateral menisectomies and chrondroplasty of patella    No family history on file. Social History   Tobacco Use  . Smoking status: Never Smoker  . Smokeless tobacco: Never Used  Substance Use Topics  . Alcohol use: No  . Drug use: No    Allergies  Allergen Reactions  . Codeine Itching  . Oxycodone Itching    Current Meds  Medication Sig  . albuterol (VENTOLIN HFA) 108 (90 Base) MCG/ACT inhaler Inhale 1 puff into the lungs every 6 (six) hours as needed for wheezing or shortness of breath.  Marland Kitchen amLODipine (NORVASC) 10 MG tablet Take 10 mg by mouth daily.  Marland Kitchen aspirin EC 81 MG tablet Take 81 mg by mouth daily. Swallow whole.  . B Complex Vitamins (VITAMIN B COMPLEX) TABS 1 tablet daily.  Marland Kitchen buPROPion (WELLBUTRIN XL) 150 MG 24 hr tablet Take 150 mg by mouth daily.  . citalopram (CELEXA) 40 MG tablet Take 20 mg by mouth daily.   . diphenhydrAMINE (BENADRYL) 25 mg capsule Take 1 capsule (25 mg total) by mouth every 4 (four) hours  as needed.  . fish oil-omega-3 fatty acids 1000 MG capsule Take 2 g by mouth daily.  Marland Kitchen HYDROcodone-acetaminophen (NORCO/VICODIN) 5-325 MG tablet Take 1 tablet by mouth every 6 (six) hours as needed for severe pain.  Marland Kitchen losartan-hydrochlorothiazide (HYZAAR) 100-25 MG tablet Take 1 tablet by mouth daily.  . methocarbamol (ROBAXIN) 500 MG tablet Take 1 tablet (500 mg total) by mouth 2 (two) times daily.  . Multiple Vitamins-Minerals (VITAMIN D3 COMPLETE) TABS Take 1 tablet by mouth daily.  Marland Kitchen omeprazole (PRILOSEC) 20 MG capsule Take 20 mg by mouth daily.  . ondansetron (ZOFRAN ODT) 4 MG disintegrating tablet Take 1 tablet (4 mg total) by mouth every 8 (eight) hours as needed for nausea or vomiting.  Marland Kitchen oxyCODONE-acetaminophen (PERCOCET/ROXICET) 5-325 MG tablet Take 1 tablet by mouth every 6 (six) hours as needed for up to 7 days for severe pain.  . Red Yeast Rice 600 MG CAPS Take 2 capsules by mouth daily.    BP 135/81   Pulse 81   Ht 5\' 5"  (1.651 m)   Wt 200 lb (90.7 kg)   BMI 33.28 kg/m   Physical Exam Constitutional:      General: She is not in acute distress.    Appearance: She is well-developed.     Comments: Well developed, well nourished Normal grooming and hygiene     Cardiovascular:  Comments: No peripheral edema Skin:    General: Skin is warm and dry.  Neurological:     Mental Status: She is alert and oriented to person, place, and time.     Sensory: No sensory deficit.     Coordination: Coordination normal.     Gait: Gait normal.     Deep Tendon Reflexes: Reflexes are normal and symmetric.  Psychiatric:        Mood and Affect: Mood normal.        Behavior: Behavior normal.        Thought Content: Thought content normal.        Judgment: Judgment normal.     Comments: Affect normal          MEDICAL DECISION MAKING  A.  Encounter Diagnosis  Name Primary?  . Closed displaced transverse fracture of shaft of left humerus, initial encounter Yes    B. DATA  ANALYSED:   IMAGING: Interpretation of images: External images show a midshaft humerus fracture with apex lateral angulation of 18 degrees  Orders: Splint change to coaptation splint  Outside records reviewed: ER records   C. MANAGEMENT   Coaptation splint for 1 weeks then x-ray   Meds ordered this encounter  Medications  . diphenhydrAMINE (BENADRYL) 25 mg capsule    Sig: Take 1 capsule (25 mg total) by mouth every 4 (four) hours as needed.    Dispense:  60 capsule    Refill:  2  . oxyCODONE-acetaminophen (PERCOCET/ROXICET) 5-325 MG tablet    Sig: Take 1 tablet by mouth every 6 (six) hours as needed for up to 7 days for severe pain.    Dispense:  28 tablet    Refill:  0      [name], MD  06/22/2020 10:43 AM

## 2020-06-27 DIAGNOSIS — S42322A Displaced transverse fracture of shaft of humerus, left arm, initial encounter for closed fracture: Secondary | ICD-10-CM | POA: Insufficient documentation

## 2020-06-27 DIAGNOSIS — S42302A Unspecified fracture of shaft of humerus, left arm, initial encounter for closed fracture: Secondary | ICD-10-CM | POA: Insufficient documentation

## 2020-06-29 ENCOUNTER — Other Ambulatory Visit: Payer: Self-pay

## 2020-06-29 ENCOUNTER — Ambulatory Visit: Payer: Self-pay

## 2020-06-29 ENCOUNTER — Ambulatory Visit (INDEPENDENT_AMBULATORY_CARE_PROVIDER_SITE_OTHER): Payer: Self-pay | Admitting: Orthopedic Surgery

## 2020-06-29 ENCOUNTER — Encounter: Payer: Self-pay | Admitting: Orthopedic Surgery

## 2020-06-29 DIAGNOSIS — S42322D Displaced transverse fracture of shaft of humerus, left arm, subsequent encounter for fracture with routine healing: Secondary | ICD-10-CM

## 2020-06-29 NOTE — Patient Instructions (Signed)
Follow preop instructions

## 2020-06-29 NOTE — Patient Instructions (Signed)
Margaret Gonzalez  06/29/2020     @PREFPERIOPPHARMACY @   Your procedure is scheduled on  07/01/2020.   Report to [facility] at  0700  A.M.   Call this number if you have problems the morning of surgery:  [phone]   Remember:  Do not eat or drink after midnight.                        Take these medicines the morning of surgery with A SIP OF WATER  Amlodipine, wellbutrin, celexa, hydrocodone or oxycodone (if needed), robaxin, prilosec, zofran (if needed).   Use your inhaler before you come and bring your rescue inhaler with you.      Do not wear jewelry, make-up or nail polish.  Do not wear lotions, powders, or perfumes, or deodorant.  Do not shave 48 hours prior to surgery.  Men may shave face and neck.  Do not bring valuables to the hospital.  [facility] belongings or valuables.  Contacts, dentures or bridgework may not be worn into surgery.  Leave your suitcase in the car.  After surgery it may be brought to your room.  For patients admitted to the hospital, discharge time will be determined by your treatment team.  Patients discharged the day of surgery will not be allowed to drive home and must have someone with them for 24 hours.  Place clean sheets on your bed the night before your surgery and DO NOT sleep with pets this night.  Wipe down with CHG the night before and the morning of your procedure. DO NOT put CHG on your face, hair or genitals.  You have 2 packs of CHG wipes with each pack containing 2 wipes. Take the first wipe and wipe down the visible skin of your broken arm.   DO NOT remove any dressings or disturb any wraps already in place.  Take the second wipe in the package and wipe your other arm, torso, legs and back.   The CHG will make your skin feel sticky. Air dry the skin for a few minutes before dressing. Wash your face, hair and genitals with your regular soap.  After your wipe down with the CHG the night before  and the morning of your procedure, put on clean, comfortable clothes and brush your teeth.    Special instructions:  DO NOT smoke tobacco or vape the morning of your procedure.  Please read over the following fact sheets that you were given. Coughing and Deep Breathing, Surgical Site Infection Prevention, Anesthesia Post-op Instructions and Care and Recovery After Surgery       Humerus Fracture Treated With Immobilization  A humerus fracture is a break in the large bone in the upper arm (humerus). If the joint is stable and the bones are still in their normal position (nondisplaced), the injury may be treated with immobilization. This involves the use of a cast, splint, or sling to hold your arm in place. Immobilization ensures that your bones continue to stay in the correct position while your arm is healing. What are the causes? This condition may be caused by:  A fall.  A hard, direct hit to the arm.  A motor vehicle accident. What increases the risk? The following factors may make you more likely to develop this condition:  Being elderly.  Having a disease that makes the bones thin and weak. What are the signs or  symptoms? Symptoms of this condition include:  Pain.  Swelling.  Bruising.  Not being able to move your arm normally. How is this diagnosed? This condition may be diagnosed based on:  A physical exam.  X-rays of your upper arm, elbow, and shoulder.  CT scan. How is this treated? Treatment for this condition involves wearing a cast, splint, or sling until the injured area is stable enough for you to begin range-of-motion exercises. You may also be prescribed pain medicine. Follow these instructions at home: If you have a cast:  Do not stick anything inside the cast to scratch your skin. Doing that increases your risk of infection.  Check the skin around the cast every day. Tell your health care provider about any concerns.  You may put lotion on dry  skin around the edges of the cast. Do not put lotion on the skin underneath the cast.  Keep the cast clean and dry. If you have a splint or sling:  Wear the splint or sling as told by your health care provider. Remove it only as told by your health care provider.  Loosen the splint or sling if your fingers tingle, become numb, or turn cold and blue.  Keep the splint or sling clean and dry. Bathing  Do not take baths, swim, or use a hot tub until your health care provider approves. Ask your health care provider if you may take showers. You may only be allowed to take sponge baths.  If the cast, splint, or sling is not waterproof: ? Do not let it get wet. ? Cover it with a watertight covering when you take a bath or shower.  If you have a sling, remove it for bathing only if your health care provider tells you that it is safe to do that. Managing pain, stiffness, and swelling  If directed, put ice on the injured area. ? If you have a removable splint or sling, remove it as told by your health care provider. ? Put ice in a plastic bag. ? Place a towel between your skin and the bag or between your cast and the bag. ? Leave the ice on for 20 minutes, 2-3 times a day.  Move your fingers often to reduce stiffness and swelling.  Raise (elevate) the injured area above the level of your heart while you are sitting or lying down.   Driving  Do not drive or use heavy machinery while taking prescription pain medicine.  Do not drive while wearing a cast, splint, or sling on an arm that you use for driving. Ask your health care provider when it is safe to drive. Activity  Return to your normal activities as told by your health care provider. Ask your health care provider what activities are safe for you.  Do not lift anything until your health care provider says that it is safe.  Do range-of-motion exercises only as told by your health care provider or physical therapist. General  instructions  Do not put pressure on any part of the cast or splint until it is fully hardened. This may take several hours.  Do not use any products that contain nicotine or tobacco, such as cigarettes, e-cigarettes, and chewing tobacco. These can delay bone healing. If you need help quitting, ask your health care provider.  Take over-the-counter and prescription medicines only as told by your health care provider.  Ask your health care provider if the medicine prescribed to you can cause constipation. You may need to  take steps to prevent or treat constipation, such as: ? Drink enough fluid to keep your urine pale yellow. ? Take over-the-counter or prescription medicines. ? Eat foods that are high in fiber, such as beans, whole grains, and fresh fruits and vegetables. ? Limit foods that are high in fat and processed sugars, such as fried or sweet foods.  Keep all follow-up visits as told by your health care provider. This is important. Contact a health care provider if:  You have any new pain, swelling, or bruising.  Your pain, swelling, and bruising do not improve.  Your cast, splint, or sling becomes loose or damaged. Get help right away if:  Your skin or fingers on your injured arm turn blue or gray.  Your arm feels cold or numb.  You have severe pain in your injured arm. Summary  A humerus fracture is a break in the large bone in the upper arm.  Immobilization involves the use of a cast, splint, or sling to hold your arm in place while the injury heals.  Wear a splint or sling as told by your health care provider. Remove it only as told by your health care provider.  Move your fingers often to reduce stiffness and swelling. This information is not intended to replace advice given to you by your health care provider. Make sure you discuss any questions you have with your health care provider. Document Revised: 12/16/2017 Document Reviewed: 12/16/2017 Elsevier Patient  Education  2021 [employer] General Anesthesia, Adult, Care After This sheet gives you information about how to care for yourself after your procedure. Your health care provider may also give you more specific instructions. If you have problems or questions, contact your health care provider. What can I expect after the procedure? After the procedure, the following side effects are common:  Pain or discomfort at the IV site.  Nausea.  Vomiting.  Sore throat.  Trouble concentrating.  Feeling cold or chills.  Feeling weak or tired.  Sleepiness and fatigue.  Soreness and body aches. These side effects can affect parts of the body that were not involved in surgery. Follow these instructions at home: For the time period you were told by your health care provider:  Rest.  Do not participate in activities where you could fall or become injured.  Do not drive or use machinery.  Do not drink alcohol.  Do not take sleeping pills or medicines that cause drowsiness.  Do not make important decisions or sign legal documents.  Do not take care of children on your own.   Eating and drinking  Follow any instructions from your health care provider about eating or drinking restrictions.  When you feel hungry, start by eating small amounts of foods that are soft and easy to digest (bland), such as toast. Gradually return to your regular diet.  Drink enough fluid to keep your urine pale yellow.  If you vomit, rehydrate by drinking water, juice, or clear broth. General instructions  If you have sleep apnea, surgery and certain medicines can increase your risk for breathing problems. Follow instructions from your health care provider about wearing your sleep device: ? Anytime you are sleeping, including during daytime naps. ? While taking prescription pain medicines, sleeping medicines, or medicines that make you drowsy.  Have a responsible adult stay with you for the time you are  told. It is important to have someone help care for you until you are awake and alert.  Return to your normal  activities as told by your health care provider. Ask your health care provider what activities are safe for you.  Take over-the-counter and prescription medicines only as told by your health care provider.  If you smoke, do not smoke without supervision.  Keep all follow-up visits as told by your health care provider. This is important. Contact a health care provider if:  You have nausea or vomiting that does not get better with medicine.  You cannot eat or drink without vomiting.  You have pain that does not get better with medicine.  You are unable to pass urine.  You develop a skin rash.  You have a fever.  You have redness around your IV site that gets worse. Get help right away if:  You have difficulty breathing.  You have chest pain.  You have blood in your urine or stool, or you vomit blood. Summary  After the procedure, it is common to have a sore throat or nausea. It is also common to feel tired.  Have a responsible adult stay with you for the time you are told. It is important to have someone help care for you until you are awake and alert.  When you feel hungry, start by eating small amounts of foods that are soft and easy to digest (bland), such as toast. Gradually return to your regular diet.  Drink enough fluid to keep your urine pale yellow.  Return to your normal activities as told by your health care provider. Ask your health care provider what activities are safe for you. This information is not intended to replace advice given to you by your health care provider. Make sure you discuss any questions you have with your health care provider. Document Revised: 12/31/2019 Document Reviewed: 07/30/2019 Elsevier Patient Education  2021 [employer].

## 2020-06-29 NOTE — Progress Notes (Signed)
Chief Complaint  Patient presents with  . Arm Injury    06/21/20 humerus fracture left/ improving some    60 year old female midshaft left humerus fracture  8 days post injury no improvement in alignment  Recommend closed reduction and fracture cuff application in the OR  Patient agrees radial nerve risks discussed  Follow-up after surgery

## 2020-06-30 ENCOUNTER — Encounter (HOSPITAL_COMMUNITY): Payer: Self-pay

## 2020-06-30 ENCOUNTER — Other Ambulatory Visit: Payer: Self-pay

## 2020-06-30 ENCOUNTER — Other Ambulatory Visit (HOSPITAL_COMMUNITY)
Admission: RE | Admit: 2020-06-30 | Discharge: 2020-06-30 | Disposition: A | Discharge status: Home / Self Care | Payer: Self-pay | Source: Ambulatory Visit | Attending: Orthopedic Surgery | Admitting: Orthopedic Surgery

## 2020-06-30 ENCOUNTER — Encounter (HOSPITAL_COMMUNITY)
Admission: RE | Admit: 2020-06-30 | Discharge: 2020-06-30 | Disposition: A | Discharge status: Home / Self Care | Payer: Self-pay | Source: Ambulatory Visit | Attending: Orthopedic Surgery | Admitting: Orthopedic Surgery

## 2020-06-30 DIAGNOSIS — Z01818 Encounter for other preprocedural examination: Secondary | ICD-10-CM | POA: Insufficient documentation

## 2020-06-30 DIAGNOSIS — Z20822 Contact with and (suspected) exposure to covid-19: Secondary | ICD-10-CM | POA: Insufficient documentation

## 2020-06-30 HISTORY — DX: Prediabetes: R73.03

## 2020-06-30 LAB — CBC WITH DIFFERENTIAL/PLATELET
Abs Immature Granulocytes: 0.02 10*3/uL (ref 0.00–0.07)
Basophils Absolute: 0.1 10*3/uL (ref 0.0–0.1)
Basophils Relative: 1 %
Eosinophils Absolute: 0.2 10*3/uL (ref 0.0–0.5)
Eosinophils Relative: 3 %
HCT: 37.7 % (ref 36.0–46.0)
Hemoglobin: 12.1 g/dL (ref 12.0–15.0)
Immature Granulocytes: 0 %
Lymphocytes Relative: 27 %
Lymphs Abs: 2 10*3/uL (ref 0.7–4.0)
MCH: 29.3 pg (ref 26.0–34.0)
MCHC: 32.1 g/dL (ref 30.0–36.0)
MCV: 91.3 fL (ref 80.0–100.0)
Monocytes Absolute: 0.7 10*3/uL (ref 0.1–1.0)
Monocytes Relative: 10 %
Neutro Abs: 4.5 10*3/uL (ref 1.7–7.7)
Neutrophils Relative %: 59 %
Platelets: 353 10*3/uL (ref 150–400)
RBC: 4.13 MIL/uL (ref 3.87–5.11)
RDW: 14.1 % (ref 11.5–15.5)
WBC: 7.5 10*3/uL (ref 4.0–10.5)
nRBC: 0 % (ref 0.0–0.2)

## 2020-06-30 LAB — BASIC METABOLIC PANEL
Anion gap: 9 (ref 5–15)
BUN: 17 mg/dL (ref 6–20)
CO2: 28 mmol/L (ref 22–32)
Calcium: 8.8 mg/dL — ABNORMAL LOW (ref 8.9–10.3)
Chloride: 100 mmol/L (ref 98–111)
Creatinine, Ser: 0.75 mg/dL (ref 0.44–1.00)
GFR, Estimated: >60 mL/min (ref >60)
Glucose, Bld: 105 mg/dL — ABNORMAL HIGH (ref 70–99)
Potassium: 3.8 mmol/L (ref 3.5–5.1)
Sodium: 137 mmol/L (ref 135–145)

## 2020-06-30 LAB — SARS CORONAVIRUS 2 (TAT 6-24 HRS): SARS Coronavirus 2: NEGATIVE

## 2020-06-30 LAB — HEMOGLOBIN A1C
Hgb A1c MFr Bld: 5.8 % — ABNORMAL HIGH (ref 4.8–5.6)
Mean Plasma Glucose: 119.76 mg/dL

## 2020-06-30 NOTE — H&P (Signed)
Chief Complaint  Patient presents with  . 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  It was very difficult to place this coaptation splint without adequate muscle relaxation and pain control  I talked to the patient about treatment options and we both decided it was better to put her to sleep get good pain control muscle relaxation x-ray control and apply the fracture cuff    Review of Systems  All other systems reviewed and are negative.           Past Medical History:  Diagnosis Date  . Arthritis    . Depression    . Headache(784.0)    . Hypertension             Past Surgical History:  Procedure Laterality Date  . CESAREAN SECTION   [phone]    x3  . CHOLECYSTECTOMY   1986  . COLONOSCOPY      . KNEE ARTHROSCOPY Left 11/12/2012    Procedure: ARTHROSCOPY KNEE ;  Surgeon: Harvie Junior, MD;  Location: [facility];  Service: Orthopedics;  Laterality: Left;  partial medial and lateral menisectomies and chrondroplasty of patella      No family history on file. Social History        Tobacco Use  . Smoking status: Never Smoker  . Smokeless tobacco: Never Used  Substance Use Topics  . Alcohol use: No  . Drug use: No          Allergies  Allergen Reactions  . Codeine Itching  . Oxycodone Itching      Active Medications      Current Meds  Medication Sig  . albuterol (VENTOLIN HFA) 108 (90 Base) MCG/ACT inhaler Inhale 1 puff into the lungs every 6 (six) hours as needed for wheezing or shortness of breath.  Marland Kitchen amLODipine (NORVASC) 10 MG tablet Take 10 mg by mouth daily.  Marland Kitchen aspirin EC 81 MG tablet Take 81 mg by mouth daily. Swallow whole.  . B Complex Vitamins (VITAMIN B COMPLEX) TABS 1 tablet daily.  Marland Kitchen buPROPion (WELLBUTRIN XL) 150 MG 24 hr tablet Take 150 mg by  mouth daily.  . citalopram (CELEXA) 40 MG tablet Take 20 mg by mouth daily.   . diphenhydrAMINE (BENADRYL) 25 mg capsule Take 1 capsule (25 mg total) by mouth every 4 (four) hours as needed.  . fish oil-omega-3 fatty acids 1000 MG capsule Take 2 g by mouth daily.  Marland Kitchen HYDROcodone-acetaminophen (NORCO/VICODIN) 5-325 MG tablet Take 1 tablet by mouth every 6 (six) hours as needed for severe pain.  Marland Kitchen losartan-hydrochlorothiazide (HYZAAR) 100-25 MG tablet Take 1 tablet by mouth daily.  . methocarbamol (ROBAXIN) 500 MG tablet Take 1 tablet (500 mg total) by mouth 2 (two) times daily.  . Multiple Vitamins-Minerals (VITAMIN D3 COMPLETE) TABS Take 1 tablet by mouth daily.  Marland Kitchen omeprazole (PRILOSEC) 20 MG capsule Take 20 mg by mouth daily.  . ondansetron (ZOFRAN ODT) 4 MG disintegrating tablet Take 1 tablet (4 mg total) by mouth every 8 (eight) hours as needed for nausea or vomiting.  Marland Kitchen oxyCODONE-acetaminophen (PERCOCET/ROXICET) 5-325 MG tablet Take 1 tablet by mouth every 6 (six) hours as needed for up to 7 days for severe pain.  . Red Yeast Rice 600 MG CAPS Take 2 capsules by  mouth daily.        BP 135/81   Pulse 81   Ht 5\' 5"  (1.651 m)   Wt 200 lb (90.7 kg)   BMI 33.28 kg/m    Physical Exam Constitutional:      General: She is not in acute distress.    Appearance: She is well-developed.     Comments: Well developed, well nourished Normal grooming and hygiene     Cardiovascular:     Comments: No peripheral edema Skin:    General: Skin is warm and dry.  Neurological:     Mental Status: She is alert and oriented to person, place, and time.     Sensory: No sensory deficit.     Coordination: Coordination normal.     Gait: Gait normal.     Deep Tendon Reflexes: Reflexes are normal and symmetric.  Psychiatric:        Mood and Affect: Mood normal.        Behavior: Behavior normal.        Thought Content: Thought content normal.        Judgment: Judgment normal.     Comments: Affect normal       Radial nerve is intact  Fracture fragments are mobile.  She is tender she has deformity of the left humerus and upper arm neurovascular exam is otherwise normal           MEDICAL DECISION MAKING   A.  Encounter Diagnosis  Name Primary?  . Closed displaced transverse fracture of shaft of left humerus, initial encounter Yes      B. DATA ANALYSED:   IMAGING: Interpretation of images: External images show a midshaft humerus fracture with apex lateral angulation of 18 degrees  Orders: Splint change to coaptation splint  Outside records reviewed: ER records    C. MANAGEMENT  The difficulty we had placing the splint in the office on her first visit it is going to be just is difficult to try to get this into position without some type of anesthetic  Patient will need general anesthesia muscle relaxation and then manual manipulation and application of fracture cuff which I will bring from the office  Left shoulder closed reduction under anesthesia application of fracture cuff

## 2020-07-01 ENCOUNTER — Encounter (HOSPITAL_COMMUNITY): Payer: Self-pay | Admitting: Orthopedic Surgery

## 2020-07-01 ENCOUNTER — Encounter (HOSPITAL_COMMUNITY)
Admission: RE | Disposition: A | Discharge status: Home / Self Care | Payer: Self-pay | Source: Home / Self Care | Attending: Orthopedic Surgery

## 2020-07-01 ENCOUNTER — Ambulatory Visit (HOSPITAL_COMMUNITY): Payer: Self-pay | Admitting: Anesthesiology

## 2020-07-01 ENCOUNTER — Ambulatory Visit (HOSPITAL_COMMUNITY): Payer: Self-pay

## 2020-07-01 ENCOUNTER — Ambulatory Visit (HOSPITAL_COMMUNITY)
Admission: RE | Admit: 2020-07-01 | Discharge: 2020-07-01 | Disposition: A | Discharge status: Home / Self Care | Payer: Self-pay | Attending: Orthopedic Surgery | Admitting: Orthopedic Surgery

## 2020-07-01 DIAGNOSIS — Z79899 Other long term (current) drug therapy: Secondary | ICD-10-CM | POA: Insufficient documentation

## 2020-07-01 DIAGNOSIS — Z885 Allergy status to narcotic agent status: Secondary | ICD-10-CM | POA: Insufficient documentation

## 2020-07-01 DIAGNOSIS — S42322D Displaced transverse fracture of shaft of humerus, left arm, subsequent encounter for fracture with routine healing: Secondary | ICD-10-CM

## 2020-07-01 DIAGNOSIS — S42322A Displaced transverse fracture of shaft of humerus, left arm, initial encounter for closed fracture: Secondary | ICD-10-CM | POA: Insufficient documentation

## 2020-07-01 DIAGNOSIS — S42302A Unspecified fracture of shaft of humerus, left arm, initial encounter for closed fracture: Secondary | ICD-10-CM

## 2020-07-01 DIAGNOSIS — Z7982 Long term (current) use of aspirin: Secondary | ICD-10-CM | POA: Insufficient documentation

## 2020-07-01 DIAGNOSIS — W07XXXA Fall from chair, initial encounter: Secondary | ICD-10-CM | POA: Insufficient documentation

## 2020-07-01 HISTORY — PX: CLOSED REDUCTION HUMERUS FRACTURE: SHX985

## 2020-07-01 SURGERY — CLOSED REDUCTION, FRACTURE, HUMERUS, SHAFT
Anesthesia: General | Laterality: Left

## 2020-07-01 MED ORDER — PROMETHAZINE HCL 25 MG/ML IJ SOLN: 6.2500 mg | INTRAMUSCULAR | Status: DC | PRN

## 2020-07-01 MED ORDER — LIDOCAINE 2% (20 MG/ML) 5 ML SYRINGE
INTRAMUSCULAR | Status: DC | PRN
  Administered 2020-07-01: 100 mg via INTRAVENOUS

## 2020-07-01 MED ORDER — ONDANSETRON HCL 4 MG/2ML IJ SOLN
INTRAMUSCULAR | Status: AC
  Filled 2020-07-01: qty 2

## 2020-07-01 MED ORDER — HYDROCODONE-ACETAMINOPHEN 7.5-325 MG PO TABS
ORAL_TABLET | ORAL | Status: AC
  Filled 2020-07-01: qty 1

## 2020-07-01 MED ORDER — HYDROCODONE-ACETAMINOPHEN 5-325 MG PO TABS: 1.0000 | ORAL_TABLET | ORAL | 0 refills | Status: DC | PRN

## 2020-07-01 MED ORDER — ONDANSETRON HCL 4 MG/2ML IJ SOLN
INTRAMUSCULAR | Status: DC | PRN
  Administered 2020-07-01: 4 mg via INTRAVENOUS

## 2020-07-01 MED ORDER — FENTANYL CITRATE (PF) 100 MCG/2ML IJ SOLN: 25.0000 ug | INTRAMUSCULAR | Status: DC | PRN

## 2020-07-01 MED ORDER — MEPERIDINE HCL 50 MG/ML IJ SOLN: 6.2500 mg | INTRAMUSCULAR | Status: DC | PRN

## 2020-07-01 MED ORDER — PROPOFOL 10 MG/ML IV BOLUS
INTRAVENOUS | Status: DC | PRN
Start: 2020-07-01 — End: 2020-07-01
  Administered 2020-07-01: 200 mg via INTRAVENOUS

## 2020-07-01 MED ORDER — DEXAMETHASONE SODIUM PHOSPHATE 4 MG/ML IJ SOLN
INTRAMUSCULAR | Status: DC | PRN
  Administered 2020-07-01: 10 mg via INTRAVENOUS

## 2020-07-01 MED ORDER — MIDAZOLAM HCL 2 MG/2ML IJ SOLN
INTRAMUSCULAR | Status: AC
  Filled 2020-07-01: qty 2

## 2020-07-01 MED ORDER — MIDAZOLAM HCL 5 MG/5ML IJ SOLN
INTRAMUSCULAR | Status: DC | PRN
  Administered 2020-07-01: 2 mg via INTRAVENOUS

## 2020-07-01 MED ORDER — ORAL CARE MOUTH RINSE: 15.0000 mL | Freq: Once | OROMUCOSAL | Status: AC

## 2020-07-01 MED ORDER — HYDROCODONE-ACETAMINOPHEN 7.5-325 MG PO TABS
1.0000 | ORAL_TABLET | Freq: Once | ORAL | Status: AC
  Administered 2020-07-01: 1 via ORAL

## 2020-07-01 MED ORDER — SUGAMMADEX SODIUM 200 MG/2ML IV SOLN
INTRAVENOUS | Status: DC | PRN
  Administered 2020-07-01: 200 mg via INTRAVENOUS

## 2020-07-01 MED ORDER — CHLORHEXIDINE GLUCONATE 0.12 % MT SOLN
15.0000 mL | Freq: Once | OROMUCOSAL | Status: AC
  Administered 2020-07-01: 15 mL via OROMUCOSAL

## 2020-07-01 MED ORDER — EPHEDRINE 5 MG/ML INJ
INTRAVENOUS | Status: AC
  Filled 2020-07-01: qty 20

## 2020-07-01 MED ORDER — ROCURONIUM BROMIDE 100 MG/10ML IV SOLN
INTRAVENOUS | Status: DC | PRN
  Administered 2020-07-01: 25 mg via INTRAVENOUS

## 2020-07-01 MED ORDER — FENTANYL CITRATE (PF) 100 MCG/2ML IJ SOLN
INTRAMUSCULAR | Status: DC | PRN
  Administered 2020-07-01 (×2): 50 ug via INTRAVENOUS

## 2020-07-01 MED ORDER — EPHEDRINE SULFATE-NACL 50-0.9 MG/10ML-% IV SOSY
PREFILLED_SYRINGE | INTRAVENOUS | Status: DC | PRN
  Administered 2020-07-01 (×2): 5 mg via INTRAVENOUS
  Administered 2020-07-01 (×2): 10 mg via INTRAVENOUS

## 2020-07-01 MED ORDER — LACTATED RINGERS IV SOLN
INTRAVENOUS | Status: DC
  Administered 2020-07-01: 1000 mL via INTRAVENOUS

## 2020-07-01 MED ORDER — DEXAMETHASONE SODIUM PHOSPHATE 10 MG/ML IJ SOLN
INTRAMUSCULAR | Status: AC
  Filled 2020-07-01: qty 1

## 2020-07-01 SURGICAL SUPPLY — 12 items
BANDAGE ELASTIC 4 VELCRO NS (GAUZE/BANDAGES/DRESSINGS) ×6 IMPLANT
BANDAGE ELASTIC 6 VELCRO NS (GAUZE/BANDAGES/DRESSINGS) ×2 IMPLANT
CLOTH BEACON ORANGE TIMEOUT ST (SAFETY) IMPLANT
KIT TURNOVER KIT A (KITS) ×2 IMPLANT
SLING ARM FOAM STRAP LRG (SOFTGOODS) IMPLANT
SLING ARM FOAM STRAP MED (SOFTGOODS) IMPLANT
SLING ARM FOAM STRAP XLG (SOFTGOODS) IMPLANT
SLING ARM IMMOBILIZER LRG (SOFTGOODS) IMPLANT
SLING ARM IMMOBILIZER MED (SOFTGOODS) IMPLANT
SLING ARM IMMOBILIZER XL (CAST SUPPLIES) IMPLANT
SPLINT J IMMOBILIZER 4X20FT (CAST SUPPLIES) ×1 IMPLANT
SPLINT J PLASTER J 4INX20Y (CAST SUPPLIES) ×1

## 2020-07-01 NOTE — Anesthesia Preprocedure Evaluation (Signed)
Anesthesia Evaluation  Patient identified by MRN, date of birth, ID band Patient awake    Reviewed: Allergy & Precautions, NPO status , Patient's Chart, lab work & pertinent test results  History of Anesthesia Complications Negative for: history of anesthetic complications  Airway Mallampati: II  TM Distance: >3 FB Neck ROM: Full    Dental  (+) Dental Advisory Given   Pulmonary    Pulmonary exam normal breath sounds clear to auscultation       Cardiovascular Exercise Tolerance: Good hypertension, Pt. on medications Normal cardiovascular exam Rhythm:Regular Rate:Normal     Neuro/Psych  Headaches, PSYCHIATRIC DISORDERS Depression    GI/Hepatic negative GI ROS, Neg liver ROS,   Endo/Other  negative endocrine ROS  Renal/GU negative Renal ROS     Musculoskeletal  (+) Arthritis ,   Abdominal   Peds  Hematology negative hematology ROS (+)   Anesthesia Other Findings   Reproductive/Obstetrics                             Anesthesia Physical Anesthesia Plan  ASA: II  Anesthesia Plan: General   Post-op Pain Management:    Induction: Intravenous  PONV Risk Score and Plan: 3 and Ondansetron, Midazolam and Dexamethasone  Airway Management Planned: LMA  Additional Equipment:   Intra-op Plan:   Post-operative Plan: Extubation in OR  Informed Consent: I have reviewed the patients History and Physical, chart, labs and discussed the procedure including the risks, benefits and alternatives for the proposed anesthesia with the patient or authorized representative who has indicated his/her understanding and acceptance.     Dental advisory given  Plan Discussed with: CRNA and Surgeon  Anesthesia Plan Comments:         Anesthesia Quick Evaluation

## 2020-07-01 NOTE — Brief Op Note (Signed)
07/01/2020  9:47 AM  PATIENT:  Margaret Gonzalez  60 y.o. female  PRE-OPERATIVE DIAGNOSIS:  fracture left humerus  POST-OPERATIVE DIAGNOSIS:  fracture left humerus  PROCEDURE:  Procedure(s): CLOSED REDUCTION HUMERAL SHAFT (Left)   Operative findings I attempted to place a Sarmiento brace and took x-rays in the Sarmiento brace.  The fracture did not reduce even with adjusting of the straps  So she was placed in a valgus molded coaptation sugar tong type splint with improved reduction  Dictation Patient seen in preop.  Surgical site confirmed marked.  Chart review completed.  Patient taken to surgery for general anesthesia with relaxation.  Timeout completed. C-arm was brought in to ensure adequate images could be obtained and then Sarmiento brace was placed.  I adjusted the straps and could not get a good reduction in the brace  I took the brace off.  I placed a molded sugar tong splint with a good valgus mold and took x-rays and in both planes got improved alignment especially the lateral x-ray shows near anatomic alignment with some separation at the fracture site  On the AP there is less than 10 degrees varus.  Patient extubated taken recovery in stable condition  Postop plan continue splinting until callus and then convert to [employer] brace    SURGEON:  Surgeon(s) and Role:    * Vickki Hearing, MD - Primary  PHYSICIAN ASSISTANT:   ASSISTANTS: none   ANESTHESIA:   general  EBL:  none   BLOOD ADMINISTERED:none  DRAINS: none   LOCAL MEDICATIONS USED:  NONE  SPECIMEN:  No Specimen  DISPOSITION OF SPECIMEN:  N/A  COUNTS:  YES  TOURNIQUET:  * No tourniquets in log *  DICTATION: .Dragon Dictation  PLAN OF CARE: Discharge to home after PACU  PATIENT DISPOSITION:  PACU - hemodynamically stable.   Delay start of Pharmacological VTE agent (>24hrs) due to surgical blood loss or risk of bleeding: not applicable

## 2020-07-01 NOTE — Anesthesia Procedure Notes (Signed)
Procedure Name: Intubation Date/Time: 07/01/2020 8:50 AM Performed by: Hewitt Blade, CRNA Pre-anesthesia Checklist: Patient identified, Emergency Drugs available, Suction available and Patient being monitored Patient Re-evaluated:Patient Re-evaluated prior to induction Oxygen Delivery Method: Circle system utilized Preoxygenation: Pre-oxygenation with 100% oxygen Induction Type: IV induction Ventilation: Mask ventilation without difficulty Laryngoscope Size: Mac and 3 Grade View: Grade I Tube type: Oral Tube size: 7.0 mm Number of attempts: 1 Airway Equipment and Method: Stylet and Oral airway Placement Confirmation: ETT inserted through vocal cords under direct vision,  positive ETCO2 and breath sounds checked- equal and bilateral Secured at: 21 cm Tube secured with: Tape Dental Injury: Teeth and Oropharynx as per pre-operative assessment

## 2020-07-01 NOTE — Transfer of Care (Signed)
Immediate Anesthesia Transfer of Care Note  Patient: Margaret Gonzalez  Procedure(s) Performed: CLOSED REDUCTION HUMERAL SHAFT (Left )  Patient Location: PACU  Anesthesia Type:General  Level of Consciousness: awake, alert  and oriented  Airway & Oxygen Therapy: Patient Spontanous Breathing  Post-op Assessment: Report given to RN and Post -op Vital signs reviewed and stable  Post vital signs: Reviewed and stable  Last Vitals:  Vitals Value Taken Time  BP 165/63 07/01/20 1000  Temp 36.7 C 07/01/20 1000  Pulse 88 07/01/20 1008  Resp 16 07/01/20 1008  SpO2 93 % 07/01/20 1008  Vitals shown include unvalidated device data.  Last Pain:  Vitals:   07/01/20 1000  TempSrc:   PainSc: 0-No pain      Patients Stated Pain Goal: 3 (07/01/20 1000)  Complications: No complications documented.

## 2020-07-01 NOTE — Brief Op Note (Signed)
07/01/2020  9:50 AM  PATIENT:  Margaret Gonzalez  59 y.o. female  PRE-OPERATIVE DIAGNOSIS:  fracture left humerus  POST-OPERATIVE DIAGNOSIS:  fracture left humerus  PROCEDURE:  Procedure(s): CLOSED REDUCTION HUMERAL SHAFT (Left)   Operative findings I attempted to place a Sarmiento brace and took x-rays in the Sarmiento brace.  The fracture did not reduce even with adjusting of the straps  So she was placed in a valgus molded coaptation sugar tong type splint with improved reduction  Dictation Patient seen in preop.  Surgical site confirmed marked.  Chart review completed.  Patient taken to surgery for general anesthesia with relaxation.  Timeout completed. C-arm was brought in to ensure adequate images could be obtained and then Sarmiento brace was placed.  I adjusted the straps and could not get a good reduction in the brace  I took the brace off.  I placed a molded sugar tong splint with a good valgus mold and took x-rays and in both planes got improved alignment especially the lateral x-ray shows near anatomic alignment with some separation at the fracture site  On the AP there is less than 10 degrees varus.  Patient extubated taken recovery in stable condition  Postop plan continue splinting until callus and then convert to Sarmiento brace    SURGEON:  Surgeon(s) and Role:    * Zadrian Mccauley E, MD - Primary  PHYSICIAN ASSISTANT:   ASSISTANTS: none   ANESTHESIA:   general  EBL:  none   BLOOD ADMINISTERED:none  DRAINS: none   LOCAL MEDICATIONS USED:  NONE  SPECIMEN:  No Specimen  DISPOSITION OF SPECIMEN:  N/A  COUNTS:  YES  TOURNIQUET:  * No tourniquets in log *  DICTATION: .Dragon Dictation  PLAN OF CARE: Discharge to home after PACU  PATIENT DISPOSITION:  PACU - hemodynamically stable.   Delay start of Pharmacological VTE agent (>24hrs) due to surgical blood loss or risk of bleeding: not applicable  

## 2020-07-01 NOTE — Interval H&P Note (Signed)
History and Physical Interval Note:  07/01/2020 8:32 AM  Margaret Gonzalez  has presented today for surgery, with the diagnosis of fracture left humerus.  The various methods of treatment have been discussed with the patient and family. After consideration of risks, benefits and other options for treatment, the patient has consented to  Procedure(s): CLOSED REDUCTION HUMERAL SHAFT (Left) as a surgical intervention.  The patient's history has been reviewed, patient examined, no change in status, stable for surgery.  I have reviewed the patient's chart and labs.  Questions were answered to the patient's satisfaction.     [name]

## 2020-07-01 NOTE — Anesthesia Postprocedure Evaluation (Signed)
Anesthesia Post Note  Patient: Margaret Gonzalez  Procedure(s) Performed: CLOSED REDUCTION HUMERAL SHAFT (Left )  Patient location during evaluation: PACU Anesthesia Type: General Level of consciousness: awake and alert Pain management: pain level controlled Vital Signs Assessment: post-procedure vital signs reviewed and stable Respiratory status: spontaneous breathing, nonlabored ventilation and respiratory function stable Cardiovascular status: stable Postop Assessment: no apparent nausea or vomiting Anesthetic complications: no   No complications documented.   Last Vitals:  Vitals:   07/01/20 0759 07/01/20 1000  BP: 126/79 (!) 165/63  Pulse: 71   Resp: 16   Temp: 36.8 C 36.7 C  SpO2: 98%     Last Pain:  Vitals:   07/01/20 1000  TempSrc:   PainSc: 0-No pain                 Teresina Bugaj Hristova

## 2020-07-01 NOTE — Op Note (Signed)
07/01/2020  9:50 AM  PATIENT:  Margaret Gonzalez  60 y.o. female  PRE-OPERATIVE DIAGNOSIS:  fracture left humerus  POST-OPERATIVE DIAGNOSIS:  fracture left humerus  PROCEDURE:  Procedure(s): CLOSED REDUCTION HUMERAL SHAFT (Left)   Operative findings I attempted to place a Sarmiento brace and took x-rays in the Sarmiento brace.  The fracture did not reduce even with adjusting of the straps  So she was placed in a valgus molded coaptation sugar tong type splint with improved reduction  Dictation Patient seen in preop.  Surgical site confirmed marked.  Chart review completed.  Patient taken to surgery for general anesthesia with relaxation.  Timeout completed. C-arm was brought in to ensure adequate images could be obtained and then Sarmiento brace was placed.  I adjusted the straps and could not get a good reduction in the brace  I took the brace off.  I placed a molded sugar tong splint with a good valgus mold and took x-rays and in both planes got improved alignment especially the lateral x-ray shows near anatomic alignment with some separation at the fracture site  On the AP there is less than 10 degrees varus.  Patient extubated taken recovery in stable condition  Postop plan continue splinting until callus and then convert to [employer] brace    SURGEON:  Surgeon(s) and Role:    * Vickki Hearing, MD - Primary  PHYSICIAN ASSISTANT:   ASSISTANTS: none   ANESTHESIA:   general  EBL:  none   BLOOD ADMINISTERED:none  DRAINS: none   LOCAL MEDICATIONS USED:  NONE  SPECIMEN:  No Specimen  DISPOSITION OF SPECIMEN:  N/A  COUNTS:  YES  TOURNIQUET:  * No tourniquets in log *  DICTATION: .Dragon Dictation  PLAN OF CARE: Discharge to home after PACU  PATIENT DISPOSITION:  PACU - hemodynamically stable.   Delay start of Pharmacological VTE agent (>24hrs) due to surgical blood loss or risk of bleeding: not applicable

## 2020-07-04 ENCOUNTER — Encounter (HOSPITAL_COMMUNITY): Payer: Self-pay | Admitting: Orthopedic Surgery

## 2020-07-06 ENCOUNTER — Other Ambulatory Visit: Payer: Self-pay

## 2020-07-06 ENCOUNTER — Ambulatory Visit (INDEPENDENT_AMBULATORY_CARE_PROVIDER_SITE_OTHER): Payer: Self-pay | Admitting: Orthopedic Surgery

## 2020-07-06 DIAGNOSIS — S42322D Displaced transverse fracture of shaft of humerus, left arm, subsequent encounter for fracture with routine healing: Secondary | ICD-10-CM

## 2020-07-06 NOTE — Progress Notes (Signed)
Postop  Date of surgery  Postop day number  Date of original injury  Current treatment coaptation splint  Splint and mold intact   Radial nerve intact :  Thumb ext and wrist ext   xrays in the splint in 1 week

## 2020-07-13 ENCOUNTER — Ambulatory Visit (INDEPENDENT_AMBULATORY_CARE_PROVIDER_SITE_OTHER): Payer: Self-pay | Admitting: Orthopedic Surgery

## 2020-07-13 ENCOUNTER — Encounter: Payer: Self-pay | Admitting: Orthopedic Surgery

## 2020-07-13 ENCOUNTER — Ambulatory Visit: Payer: Self-pay

## 2020-07-13 ENCOUNTER — Other Ambulatory Visit: Payer: Self-pay

## 2020-07-13 VITALS — Ht 65.0 in | Wt 200.0 lb

## 2020-07-13 DIAGNOSIS — M79632 Pain in left forearm: Secondary | ICD-10-CM

## 2020-07-13 DIAGNOSIS — S42322D Displaced transverse fracture of shaft of humerus, left arm, subsequent encounter for fracture with routine healing: Secondary | ICD-10-CM

## 2020-07-13 NOTE — Progress Notes (Signed)
Chief Complaint  Patient presents with  . Fracture    Lt humerus   doi 2/22 fov 2/23 Dos 89/77  60 year old female, uninsured, her injury was on 22 February so she is 24 days out her close reduction was 12 days ago  I do not see any callus I see distraction she still angulated 22 to 24 degrees  She will apply for [employer]  I would prefer to try nonoperative treatment if possible  Radial nerve intact  Follow-up in a week x-ray again attempt to place fracture cuff

## 2020-07-20 ENCOUNTER — Other Ambulatory Visit: Payer: Self-pay

## 2020-07-20 ENCOUNTER — Ambulatory Visit: Payer: Self-pay

## 2020-07-20 ENCOUNTER — Ambulatory Visit (INDEPENDENT_AMBULATORY_CARE_PROVIDER_SITE_OTHER): Payer: Self-pay | Admitting: Orthopedic Surgery

## 2020-07-20 ENCOUNTER — Encounter: Payer: Self-pay | Admitting: Orthopedic Surgery

## 2020-07-20 DIAGNOSIS — S42322D Displaced transverse fracture of shaft of humerus, left arm, subsequent encounter for fracture with routine healing: Secondary | ICD-10-CM

## 2020-07-20 DIAGNOSIS — K760 Fatty (change of) liver, not elsewhere classified: Secondary | ICD-10-CM | POA: Insufficient documentation

## 2020-07-20 DIAGNOSIS — F419 Anxiety disorder, unspecified: Secondary | ICD-10-CM | POA: Insufficient documentation

## 2020-07-20 DIAGNOSIS — I10 Essential (primary) hypertension: Secondary | ICD-10-CM | POA: Insufficient documentation

## 2020-07-20 DIAGNOSIS — E1169 Type 2 diabetes mellitus with other specified complication: Secondary | ICD-10-CM | POA: Insufficient documentation

## 2020-07-20 DIAGNOSIS — E785 Hyperlipidemia, unspecified: Secondary | ICD-10-CM | POA: Insufficient documentation

## 2020-07-20 DIAGNOSIS — D509 Iron deficiency anemia, unspecified: Secondary | ICD-10-CM | POA: Insufficient documentation

## 2020-07-20 DIAGNOSIS — Z6839 Body mass index (BMI) 39.0-39.9, adult: Secondary | ICD-10-CM | POA: Insufficient documentation

## 2020-07-20 DIAGNOSIS — F4329 Adjustment disorder with other symptoms: Secondary | ICD-10-CM | POA: Insufficient documentation

## 2020-07-20 DIAGNOSIS — F339 Major depressive disorder, recurrent, unspecified: Secondary | ICD-10-CM | POA: Insufficient documentation

## 2020-07-20 MED ORDER — HYDROCODONE-ACETAMINOPHEN 5-325 MG PO TABS: 1.0000 | ORAL_TABLET | ORAL | 0 refills | Status: DC | PRN

## 2020-07-20 NOTE — Addendum Note (Signed)
Addended byCaffie Damme on: 07/20/2020 09:33 AM   Modules accepted: Orders, SmartSet

## 2020-07-20 NOTE — Patient Instructions (Signed)
Humerus Fracture Treated With ORIF A humerus fracture is a break in the in the upper arm bone (humerus). If the fracture is displaced, that means that one or more parts of the bone have been moved out of their normal position and are not lined up correctly. Open reduction with internal fixation (ORIF) is a surgical procedure that may be used to treat a humerus fracture that is displaced. In this procedure, a surgeon will move parts of the bone back into the right position. The surgeon will put in a combination of plates, screws, or other types of hardware to hold the bones in place. The hardware that is put in during the procedure will likely be left in place after you heal. You will likely not know that it is there. Tell a health care provider about:  Any allergies you have.  All medicines you are taking, including vitamins, herbs, eye drops, creams, and over-the-counter medicines.  Any problems you or family members have had with anesthetic medicines.  Any blood disorders you have.  Any surgeries you have had.  Any medical conditions you have.  Whether you are pregnant or may be pregnant. What are the risks? Generally, this is a safe procedure. However, problems may occur, including:  Excessive bleeding.  Infection.  Allergic reactions to medicines.  Damage to nerves or blood vessels.  Shoulder stiffness.  Failure of the bone to heal (nonunion). What happens before the procedure? Staying hydrated Follow instructions from your health care provider about hydration, which may include:  Up to 2 hours before the procedure - you may continue to drink clear liquids, such as water, clear fruit juice, black coffee, and plain tea.   Eating and drinking restrictions Follow instructions from your health care provider about eating and drinking, which may include:  8 hours before the procedure - stop eating heavy meals or foods such as meat, fried foods, or fatty foods.  6 hours before  the procedure - stop eating light meals or foods, such as toast or cereal.  6 hours before the procedure - stop drinking milk or drinks that contain milk.  2 hours before the procedure - stop drinking clear liquids. General instructions  Ask your health care provider about: ? Changing or stopping your regular medicines. This is especially important if you are taking diabetes medicines or blood thinners. ? Taking medicines such as aspirin and ibuprofen. These medicines can thin your blood. Do not take these medicines unless your health care provider tells you to take them. ? Taking over-the-counter medicines, vitamins, herbs, and supplements.  Plan to have someone take you home from the hospital or clinic.  Plan to have a responsible adult care for you for at least 24 hours after you leave the hospital or clinic. This is important.  Ask your health care provider how your surgical site will be marked or identified.  You may be asked to shower with a germ-killing soap.  You may be given antibiotic medicine to help prevent an infection. What happens during the procedure?  To lower your risk of infection: ? Your health care team will wash or sanitize their hands. ? Hair may be removed from the surgical area. ? Your skin will be washed with soap.  An IV will be inserted into one of your veins.  You will be given one or more of the following: ? A medicine to help you relax (sedative). ? A medicine to numb the area (local anesthetic). ? A medicine to make  you fall asleep (general anesthetic). ? A medicine that is injected into an area of your body to numb everything below the injection site (regional anesthetic).  The surgeon will make an incision through your skin over the area of the fracture.  The broken bones will be returned to their normal positions. The surgeon will use screws and a metal plate or different types of wiring to hold the bones in place.  The surgeon will close the  incision with stitches (sutures) or staples.  A bandage (dressing) will be placed over your incision.  A shoulder immobilizer may be placed to keep your arm in position. The procedure may vary among health care providers and hospitals. What happens after the procedure?  Your blood pressure, heart rate, breathing rate, and blood oxygen level will be monitored until the medicines you were given have worn off.  You will be given medicine for pain as needed.  You may have physical therapy while you are in the hospital.  You may be sent home with a sling to support your arm.  Do not drive for 24 hours if you were given a sedative during your procedure. Summary  A humerus fracture is a break in the upper arm bone (humerus).  To repair this fracture with ORIF, your surgeon will make an incision over the bone then move parts of the bone back into the right position.  A combination of screws, metal plates, and other types of hardware will be used to hold the parts of the bone in place.  The incision will be closed with stitches (sutures) or staples, and a bandage (dressing) will be placed over it.  You may be sent home with a sling to support your arm. This information is not intended to replace advice given to you by your health care provider. Make sure you discuss any questions you have with your health care provider. Document Revised: 08/06/2019 Document Reviewed: 08/06/2019 Elsevier Patient Education  2021 [employer]  Humerus Fracture Treated With ORIF, Care After This sheet gives you information about how to care for yourself after your procedure. Your health care provider may also give you more specific instructions. If you have problems or questions, contact your health care provider. What can I expect after the procedure? After the procedure, it is common to have:  Pain.  Swelling.  Stiffness.  Small amount of fluid from the incision. Follow these instructions at  home: Bathing  Do not take baths, swim, or use a hot tub until your health care provider approves. Ask your health care provider if you can take showers. You may only be allowed to take sponge baths.  If your sling or immobilizer is not waterproof, cover it with a watertight covering when you take a bath or a shower.  Keep the bandage (dressing) dry until your health care provider says it can be removed. If you have a sling or a shoulder immobilizer:  Wear the sling or immobilizer as told by your health care provider. Remove it only as told by your health care provider.  Loosen the sling or immobilizer if your fingers tingle, become numb, or turn cold and blue.  Keep the sling or immobilizer clean.  If the sling or immobilizer is not waterproof: ? Do not let it get wet. ? Cover it with a watertight covering when you take a bath or a shower. Incision care  Follow instructions from your health care provider about how to take care of your incision. Make  sure you: ? Wash your hands with soap and water before you change your dressing. If soap and water are not available, use hand sanitizer. ? Change your dressing as told by your health care provider. ? Leave stitches (sutures), skin glue, or adhesive strips in place. These skin closures may need to stay in place for 2 weeks or longer. If adhesive strip edges start to loosen and curl up, you may trim the loose edges. Do not remove adhesive strips completely unless your health care provider tells you to do that.  Check your incision area every day for signs of infection. Check for: ? Redness. ? More swelling or pain. ? Blood or more fluid. ? Warmth. ? Pus or a bad smell.   Managing pain, stiffness, and swelling  If directed, put ice on the affected area. ? Put ice in a plastic bag or use the icing device (cold therapy unit) that you were given. Follow instructions from your health care provider about how to use the icing device. ? Place  a towel between your skin and the bag or icing device. ? Leave the ice or icing device on for 20 minutes, 2-3 times a day.  Move your fingers often to avoid stiffness and to lessen swelling.  If told by your health care provider, raise (elevate) the injured area above the level of your heart while you are sitting or lying down. To do this, try putting a few pillows under your arm.   Driving  Do not drive or use heavy machinery while taking prescription pain medicine.  Ask your health care provider when it is safe to drive if you have a sling or immobilizer. Activity  Return to your normal activities as told by your health care provider. Ask your health care provider what activities are safe for you.  Do exercises as told by your health care provider.  Do not lift with your injured arm until your health care provider approves.  Avoid pulling and pushing.  Follow instructions from your health care provider about: ? Whether you may gently use your hand and elbow immediately after surgery. ? When to start doing gentle range of motion movements with your shoulder and elbow. It is important to do these movements to prevent loss of motion. General instructions  Take over-the-counter and prescription medicines only as told by your health care provider.  Do not use any products that contain nicotine or tobacco, such as cigarettes and e-cigarettes. These can delay bone healing. If you need help quitting, ask your health care provider.  If you are taking prescription pain medicine, take actions to prevent or treat constipation. Your health care provider may recommend that you: ? Drink enough fluid to keep your urine pale yellow. ? Eat foods that are high in fiber, such as fresh fruits and vegetables, whole grains, and beans. ? Limit foods that are high in fat and processed sugars, such as fried or sweet foods. ? Take an over-the-counter or prescription medicine for constipation.  Keep all  follow-up visits as told by your health care provider. This is important. Contact a health care provider if:  You have pain that is not helped by medicine.  You have a fever.  You have redness around your incision.  You have more swelling or pain around your incision.  You have blood or more fluid coming from your incision.  Your incision feels warm to the touch.  You have pus or a bad smell coming from your incision  or your dressing.  You feel faint or light-headed.  You develop a rash. Get help right away if:  The edges of your incision come apart after the stitches or staples have been taken out.  You have trouble breathing.  You have numbness or tingling in your hand or fingers. Summary  After this procedure, it is common to have some pain, swelling, or stiffness.  If your sling or immobilizer is not waterproof, do not let it get wet.  Contact your health care provider if you have blood or more fluid coming from your incision.  Get medical help right away if you have numbness or tingling in your hands or fingers. This information is not intended to replace advice given to you by your health care provider. Make sure you discuss any questions you have with your health care provider. Document Revised: 08/06/2019 Document Reviewed: 08/06/2019 Elsevier Patient Education  2021 [employer].

## 2020-07-20 NOTE — Progress Notes (Signed)
Chief Complaint  Patient presents with  . Arm Injury    Fracture left humerus 06/21/20 closed reduction 07/01/20     Fracture care:   doi 2/22 fov 2/23 dos 3/4  Ms. Samara is in good condition.  Her radial nerve is intact.  Her x-ray shows persistent fracture gap and angulation with minimal callus formation  We discussed possibility of doing an ORIF to improve the alignment and speed of the healing process.  Although she is reluctant to do that she is in agreement after showing her the x-rays of the knee intervention needs to be done  We will set up an ORIF of the left humerus with a 2-week postop follow-up and we refilled her medication today  Meds ordered this encounter  Medications  . HYDROcodone-acetaminophen (NORCO/VICODIN) 5-325 MG tablet    Sig: Take 1 tablet by mouth every 4 (four) hours as needed for up to 10 doses for moderate pain.    Dispense:  10 tablet    Refill:  0

## 2020-07-22 NOTE — Patient Instructions (Signed)
Margaret Gonzalez  07/22/2020     @PREFPERIOPPHARMACY @   Your procedure is scheduled on 07/26/2020   Report to [facility] at  0700  A.M.   Call this number if you have problems the morning of surgery:  [phone]   Remember:  Do not eat or drink after midnight.                         Take these medicines the morning of surgery with A SIP OF WATER  Amlodipine, wellbutrin, celexa, hydrocodone (if needed), robaxin, prilosec, zofran (if needed).  Use your inhaler before you come and bring your rescue inhaler with you.    Place cleans sheets on your bed the night before your procedure and DO NOT sleep with pets this night.   Shower with CHG the night before and the morning of your procedure. DO NOT put CHG on your face, hair or genitals.  After each shower, dry off with a clean towel, put on clean, comfortable clothes and brush your teeth.      Do not wear jewelry, make-up or nail polish.  Do not wear lotions, powders, or perfumes, or deodorant.  Do not shave 48 hours prior to surgery.  Men may shave face and neck.  Do not bring valuables to the hospital.  [facility] is not responsible for any belongings or valuables.   Contacts, dentures or bridgework may not be worn into surgery.  Leave your suitcase in the car.  After surgery it may be brought to your room.  For patients admitted to the hospital, discharge time will be determined by your treatment team.  Patients discharged the day of surgery will not be allowed to drive home and must have somoeone with them for 24 hours.    Special instructions:  DO NOT smoke tobacco or vape the morning of your procedure.    Please read over the following fact sheets that you were given. Pain Booklet, Coughing and Deep Breathing, Surgical Site Infection Prevention, Anesthesia Post-op Instructions and Care and Recovery After Surgery       Humerus Fracture Treated With ORIF, Care After This sheet gives you information  about how to care for yourself after your procedure. Your health care provider may also give you more specific instructions. If you have problems or questions, contact your health care provider. What can I expect after the procedure? After the procedure, it is common to have:  Pain.  Swelling.  Stiffness.  Small amount of fluid from the incision. Follow these instructions at home: Bathing  Do not take baths, swim, or use a hot tub until your health care provider approves. Ask your health care provider if you can take showers. You may only be allowed to take sponge baths.  If your sling or immobilizer is not waterproof, cover it with a watertight covering when you take a bath or a shower.  Keep the bandage (dressing) dry until your health care provider says it can be removed. If you have a sling or a shoulder immobilizer:  Wear the sling or immobilizer as told by your health care provider. Remove it only as told by your health care provider.  Loosen the sling or immobilizer if your fingers tingle, become numb, or turn cold and blue.  Keep the sling or immobilizer clean.  If the sling or immobilizer is not waterproof: ? Do not let it get wet. ? Cover it with  a watertight covering when you take a bath or a shower. Incision care  Follow instructions from your health care provider about how to take care of your incision. Make sure you: ? Wash your hands with soap and water before you change your dressing. If soap and water are not available, use hand sanitizer. ? Change your dressing as told by your health care provider. ? Leave stitches (sutures), skin glue, or adhesive strips in place. These skin closures may need to stay in place for 2 weeks or longer. If adhesive strip edges start to loosen and curl up, you may trim the loose edges. Do not remove adhesive strips completely unless your health care provider tells you to do that.  Check your incision area every day for signs of  infection. Check for: ? Redness. ? More swelling or pain. ? Blood or more fluid. ? Warmth. ? Pus or a bad smell.   Managing pain, stiffness, and swelling  If directed, put ice on the affected area. ? Put ice in a plastic bag or use the icing device (cold therapy unit) that you were given. Follow instructions from your health care provider about how to use the icing device. ? Place a towel between your skin and the bag or icing device. ? Leave the ice or icing device on for 20 minutes, 2-3 times a day.  Move your fingers often to avoid stiffness and to lessen swelling.  If told by your health care provider, raise (elevate) the injured area above the level of your heart while you are sitting or lying down. To do this, try putting a few pillows under your arm.   Driving  Do not drive or use heavy machinery while taking prescription pain medicine.  Ask your health care provider when it is safe to drive if you have a sling or immobilizer. Activity  Return to your normal activities as told by your health care provider. Ask your health care provider what activities are safe for you.  Do exercises as told by your health care provider.  Do not lift with your injured arm until your health care provider approves.  Avoid pulling and pushing.  Follow instructions from your health care provider about: ? Whether you may gently use your hand and elbow immediately after surgery. ? When to start doing gentle range of motion movements with your shoulder and elbow. It is important to do these movements to prevent loss of motion. General instructions  Take over-the-counter and prescription medicines only as told by your health care provider.  Do not use any products that contain nicotine or tobacco, such as cigarettes and e-cigarettes. These can delay bone healing. If you need help quitting, ask your health care provider.  If you are taking prescription pain medicine, take actions to prevent or  treat constipation. Your health care provider may recommend that you: ? Drink enough fluid to keep your urine pale yellow. ? Eat foods that are high in fiber, such as fresh fruits and vegetables, whole grains, and beans. ? Limit foods that are high in fat and processed sugars, such as fried or sweet foods. ? Take an over-the-counter or prescription medicine for constipation.  Keep all follow-up visits as told by your health care provider. This is important. Contact a health care provider if:  You have pain that is not helped by medicine.  You have a fever.  You have redness around your incision.  You have more swelling or pain around your incision.  You have blood or more fluid coming from your incision.  Your incision feels warm to the touch.  You have pus or a bad smell coming from your incision or your dressing.  You feel faint or light-headed.  You develop a rash. Get help right away if:  The edges of your incision come apart after the stitches or staples have been taken out.  You have trouble breathing.  You have numbness or tingling in your hand or fingers. Summary  After this procedure, it is common to have some pain, swelling, or stiffness.  If your sling or immobilizer is not waterproof, do not let it get wet.  Contact your health care provider if you have blood or more fluid coming from your incision.  Get medical help right away if you have numbness or tingling in your hands or fingers. This information is not intended to replace advice given to you by your health care provider. Make sure you discuss any questions you have with your health care provider. Document Revised: 08/06/2019 Document Reviewed: 08/06/2019 Elsevier Patient Education  2021 Elsevier Inc. General Anesthesia, Adult, Care After This sheet gives you information about how to care for yourself after your procedure. Your health care provider may also give you more specific instructions. If you  have problems or questions, contact your health care provider. What can I expect after the procedure? After the procedure, the following side effects are common:  Pain or discomfort at the IV site.  Nausea.  Vomiting.  Sore throat.  Trouble concentrating.  Feeling cold or chills.  Feeling weak or tired.  Sleepiness and fatigue.  Soreness and body aches. These side effects can affect parts of the body that were not involved in surgery. Follow these instructions at home: For the time period you were told by your health care provider:  Rest.  Do not participate in activities where you could fall or become injured.  Do not drive or use machinery.  Do not drink alcohol.  Do not take sleeping pills or medicines that cause drowsiness.  Do not make important decisions or sign legal documents.  Do not take care of children on your own.   Eating and drinking  Follow any instructions from your health care provider about eating or drinking restrictions.  When you feel hungry, start by eating small amounts of foods that are soft and easy to digest (bland), such as toast. Gradually return to your regular diet.  Drink enough fluid to keep your urine pale yellow.  If you vomit, rehydrate by drinking water, juice, or clear broth. General instructions  If you have sleep apnea, surgery and certain medicines can increase your risk for breathing problems. Follow instructions from your health care provider about wearing your sleep device: ? Anytime you are sleeping, including during daytime naps. ? While taking prescription pain medicines, sleeping medicines, or medicines that make you drowsy.  Have a responsible adult stay with you for the time you are told. It is important to have someone help care for you until you are awake and alert.  Return to your normal activities as told by your health care provider. Ask your health care provider what activities are safe for you.  Take  over-the-counter and prescription medicines only as told by your health care provider.  If you smoke, do not smoke without supervision.  Keep all follow-up visits as told by your health care provider. This is important. Contact a health care provider if:  You have nausea  or vomiting that does not get better with medicine.  You cannot eat or drink without vomiting.  You have pain that does not get better with medicine.  You are unable to pass urine.  You develop a skin rash.  You have a fever.  You have redness around your IV site that gets worse. Get help right away if:  You have difficulty breathing.  You have chest pain.  You have blood in your urine or stool, or you vomit blood. Summary  After the procedure, it is common to have a sore throat or nausea. It is also common to feel tired.  Have a responsible adult stay with you for the time you are told. It is important to have someone help care for you until you are awake and alert.  When you feel hungry, start by eating small amounts of foods that are soft and easy to digest (bland), such as toast. Gradually return to your regular diet.  Drink enough fluid to keep your urine pale yellow.  Return to your normal activities as told by your health care provider. Ask your health care provider what activities are safe for you. This information is not intended to replace advice given to you by your health care provider. Make sure you discuss any questions you have with your health care provider. Document Revised: 12/31/2019 Document Reviewed: 07/30/2019 Elsevier Patient Education  2021 [employer]

## 2020-07-25 ENCOUNTER — Other Ambulatory Visit (HOSPITAL_COMMUNITY)
Admission: RE | Admit: 2020-07-25 | Discharge: 2020-07-25 | Disposition: A | Discharge status: Home / Self Care | Payer: Self-pay | Source: Ambulatory Visit | Attending: Orthopedic Surgery | Admitting: Orthopedic Surgery

## 2020-07-25 ENCOUNTER — Other Ambulatory Visit: Payer: Self-pay

## 2020-07-25 ENCOUNTER — Encounter (HOSPITAL_COMMUNITY)
Admission: RE | Admit: 2020-07-25 | Discharge: 2020-07-25 | Disposition: A | Discharge status: Home / Self Care | Payer: Self-pay | Source: Ambulatory Visit | Attending: Orthopedic Surgery | Admitting: Orthopedic Surgery

## 2020-07-25 DIAGNOSIS — Z20822 Contact with and (suspected) exposure to covid-19: Secondary | ICD-10-CM | POA: Insufficient documentation

## 2020-07-25 DIAGNOSIS — Z01812 Encounter for preprocedural laboratory examination: Secondary | ICD-10-CM | POA: Insufficient documentation

## 2020-07-25 LAB — CBC WITH DIFFERENTIAL/PLATELET
Abs Immature Granulocytes: 0.02 10*3/uL (ref 0.00–0.07)
Basophils Absolute: 0.1 10*3/uL (ref 0.0–0.1)
Basophils Relative: 1 %
Eosinophils Absolute: 0.3 10*3/uL (ref 0.0–0.5)
Eosinophils Relative: 4 %
HCT: 40.9 % (ref 36.0–46.0)
Hemoglobin: 12.9 g/dL (ref 12.0–15.0)
Immature Granulocytes: 0 %
Lymphocytes Relative: 33 %
Lymphs Abs: 2.6 10*3/uL (ref 0.7–4.0)
MCH: 28.9 pg (ref 26.0–34.0)
MCHC: 31.5 g/dL (ref 30.0–36.0)
MCV: 91.7 fL (ref 80.0–100.0)
Monocytes Absolute: 0.8 10*3/uL (ref 0.1–1.0)
Monocytes Relative: 10 %
Neutro Abs: 4.2 10*3/uL (ref 1.7–7.7)
Neutrophils Relative %: 52 %
Platelets: 292 10*3/uL (ref 150–400)
RBC: 4.46 MIL/uL (ref 3.87–5.11)
RDW: 13.6 % (ref 11.5–15.5)
WBC: 8 10*3/uL (ref 4.0–10.5)
nRBC: 0 % (ref 0.0–0.2)

## 2020-07-25 LAB — BASIC METABOLIC PANEL
Anion gap: 15 (ref 5–15)
BUN: 26 mg/dL — ABNORMAL HIGH (ref 6–20)
CO2: 22 mmol/L (ref 22–32)
Calcium: 9.3 mg/dL (ref 8.9–10.3)
Chloride: 98 mmol/L (ref 98–111)
Creatinine, Ser: 0.75 mg/dL (ref 0.44–1.00)
GFR, Estimated: >60 mL/min (ref >60)
Glucose, Bld: 80 mg/dL (ref 70–99)
Potassium: 3.7 mmol/L (ref 3.5–5.1)
Sodium: 135 mmol/L (ref 135–145)

## 2020-07-25 NOTE — H&P (Signed)
Margaret Gonzalez is an 60 y.o. female.    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 Arm Injury    Fracture left humerus 06/21/20 closed reduction 07/01/20   Injury date was February 22 seen in the office February 23 placed in a coaptation splint on that day.  After evaluation it was thought that the fracture was not in good position she was taken to surgery for closed reduction but lost that reduction.  That surgery was on March 4  Last visit in the office I decided to offer her surgical intervention which she thought as I did would be better for her overall fracture alignment and healing  She presents now for open reduction internal fixation left humerus   Past Medical History:  Diagnosis Date  . Arthritis   . Depression   . Headache(784.0)   . Hypertension   . Pre-diabetes     Past Surgical History:  Procedure Laterality Date  . CESAREAN SECTION  [phone]   x3  . CHOLECYSTECTOMY  1986  . CLOSED REDUCTION HUMERUS FRACTURE Left 07/01/2020   Procedure: CLOSED REDUCTION HUMERAL SHAFT;  Surgeon: Vickki Hearing, MD;  Location: [facility];  Service: Orthopedics;  Laterality: Left;  . COLONOSCOPY    . KNEE ARTHROSCOPY Left 11/12/2012   Procedure: ARTHROSCOPY KNEE ;  Surgeon: Harvie Junior, MD;  Location: [facility];  Service: Orthopedics;  Laterality: Left;  partial medial and lateral menisectomies and chrondroplasty of patella  . KNEE ARTHROSCOPY Right     No family history on file. Social History:  reports that she has never smoked. She has never used smokeless tobacco. She reports current alcohol use. She reports that she does not use drugs.  Allergies:  Allergies  Allergen Reactions  . Codeine Itching  . Oxycodone Itching    No medications prior to admission.    Results for orders placed or performed during the hospital encounter of 07/25/20 (from the past 48  hour(s))  CBC WITH DIFFERENTIAL     Status: None   Collection Time: 07/25/20  8:24 AM  Result Value Ref Range   WBC 8.0 4.0 - 10.5 K/uL   RBC 4.46 3.87 - 5.11 MIL/uL   Hemoglobin 12.9 12.0 - 15.0 g/dL   HCT 91.4 78.2 - 95.6 %   MCV 91.7 80.0 - 100.0 fL   MCH 28.9 26.0 - 34.0 pg   MCHC 31.5 30.0 - 36.0 g/dL   RDW 21.3 08.6 - 57.8 %   Platelets 292 150 - 400 K/uL   nRBC 0.0 0.0 - 0.2 %   Neutrophils Relative % 52 %   Neutro Abs 4.2 1.7 - 7.7 K/uL   Lymphocytes Relative 33 %   Lymphs Abs 2.6 0.7 - 4.0 K/uL   Monocytes Relative 10 %   Monocytes Absolute 0.8 0.1 - 1.0 K/uL   Eosinophils Relative 4 %   Eosinophils Absolute 0.3 0.0 - 0.5 K/uL   Basophils Relative 1 %   Basophils Absolute 0.1 0.0 - 0.1 K/uL   Immature Granulocytes 0 %   Abs Immature Granulocytes 0.02 0.00 - 0.07 K/uL    Comment: Performed at [facility], [address], [place], [place] [address]  Basic metabolic panel     Status: Abnormal   Collection Time: 07/25/20  8:24 AM  Result Value Ref Range   Sodium 135 135 - 145 mmol/L   Potassium 3.7 3.5 -  5.1 mmol/L   Chloride 98 98 - 111 mmol/L   CO2 22 22 - 32 mmol/L   Glucose, Bld 80 70 - 99 mg/dL    Comment: Glucose reference range applies only to samples taken after fasting for at least 8 hours.   BUN 26 (H) 6 - 20 mg/dL   Creatinine, Ser 1.15 0.44 - 1.00 mg/dL   Calcium 9.3 8.9 - 72.6 mg/dL   GFR, Estimated >20 >35 mL/min    Comment: (NOTE) Calculated using the CKD-EPI Creatinine Equation (2021)    Anion gap 15 5 - 15    Comment: Performed at [facility], [address], [place], [place] [address]   No results found.  Review of Systems No chest pain shortness of breath or radial nerve problems at this time There were no vitals taken for this visit. Physical Exam  Cardiovascular palpation of pulses show normal temperature no edema good color capillary refill without swelling of the left upper extremity  Her gait and station are unremarkable and seem to  be normal  She has some tenderness at the fracture site her elbow and hand motion are normal stability of the wrist is intact muscle strength and tone is normal in terms of radial nerve function  Skin is warm dry and intact without erythema she has normal sensation she is oriented x3 mood and affect are normal  Overall appearance normal development and nutrition medium body habitus well-groomed   Assessment/Plan Left humerus fracture failing to progress towards healing has a large fracture gap and immobilization is not providing adequate maintenance of reduction  Radial nerve is at risk as patient is aware  Patient is given informed consent to proceed with open reduction internal fixation left humerus    [name], MD 07/25/2020, 5:21 PM

## 2020-07-26 ENCOUNTER — Ambulatory Visit (HOSPITAL_COMMUNITY): Payer: Self-pay | Admitting: Anesthesiology

## 2020-07-26 ENCOUNTER — Ambulatory Visit (HOSPITAL_COMMUNITY): Payer: Self-pay

## 2020-07-26 ENCOUNTER — Encounter (HOSPITAL_COMMUNITY): Payer: Self-pay | Admitting: Orthopedic Surgery

## 2020-07-26 ENCOUNTER — Encounter (HOSPITAL_COMMUNITY)
Admission: RE | Disposition: A | Discharge status: Home / Self Care | Payer: Self-pay | Source: Home / Self Care | Attending: Orthopedic Surgery

## 2020-07-26 ENCOUNTER — Ambulatory Visit (HOSPITAL_COMMUNITY)
Admission: RE | Admit: 2020-07-26 | Discharge: 2020-07-26 | Disposition: A | Discharge status: Home / Self Care | Payer: Self-pay | Attending: Orthopedic Surgery | Admitting: Orthopedic Surgery

## 2020-07-26 DIAGNOSIS — Z9049 Acquired absence of other specified parts of digestive tract: Secondary | ICD-10-CM | POA: Insufficient documentation

## 2020-07-26 DIAGNOSIS — S42322P Displaced transverse fracture of shaft of humerus, left arm, subsequent encounter for fracture with malunion: Secondary | ICD-10-CM

## 2020-07-26 DIAGNOSIS — I10 Essential (primary) hypertension: Secondary | ICD-10-CM | POA: Insufficient documentation

## 2020-07-26 DIAGNOSIS — R7303 Prediabetes: Secondary | ICD-10-CM | POA: Insufficient documentation

## 2020-07-26 DIAGNOSIS — X58XXXA Exposure to other specified factors, initial encounter: Secondary | ICD-10-CM | POA: Insufficient documentation

## 2020-07-26 DIAGNOSIS — S42322A Displaced transverse fracture of shaft of humerus, left arm, initial encounter for closed fracture: Secondary | ICD-10-CM

## 2020-07-26 DIAGNOSIS — Z885 Allergy status to narcotic agent status: Secondary | ICD-10-CM | POA: Insufficient documentation

## 2020-07-26 DIAGNOSIS — S42302A Unspecified fracture of shaft of humerus, left arm, initial encounter for closed fracture: Secondary | ICD-10-CM | POA: Insufficient documentation

## 2020-07-26 HISTORY — PX: ORIF HUMERUS FRACTURE: SHX2126

## 2020-07-26 LAB — SARS CORONAVIRUS 2 (TAT 6-24 HRS): SARS Coronavirus 2: NEGATIVE

## 2020-07-26 SURGERY — OPEN REDUCTION INTERNAL FIXATION (ORIF) HUMERAL SHAFT FRACTURE
Anesthesia: General | Site: Arm Upper | Laterality: Left

## 2020-07-26 MED ORDER — HYDROCODONE-ACETAMINOPHEN 5-325 MG PO TABS
ORAL_TABLET | ORAL | Status: AC
  Filled 2020-07-26: qty 2

## 2020-07-26 MED ORDER — FENTANYL CITRATE (PF) 250 MCG/5ML IJ SOLN
INTRAMUSCULAR | Status: AC
  Filled 2020-07-26: qty 5

## 2020-07-26 MED ORDER — LACTATED RINGERS IV SOLN: INTRAVENOUS | Status: DC

## 2020-07-26 MED ORDER — PROMETHAZINE HCL 25 MG/ML IJ SOLN: 6.2500 mg | INTRAMUSCULAR | Status: DC | PRN

## 2020-07-26 MED ORDER — HYDROCODONE-ACETAMINOPHEN 5-325 MG PO TABS
2.0000 | ORAL_TABLET | Freq: Once | ORAL | Status: AC
  Administered 2020-07-26: 2 via ORAL

## 2020-07-26 MED ORDER — IBUPROFEN 800 MG PO TABS
800.0000 mg | ORAL_TABLET | Freq: Once | ORAL | Status: AC
  Administered 2020-07-26: 800 mg via ORAL

## 2020-07-26 MED ORDER — EPHEDRINE 5 MG/ML INJ
INTRAVENOUS | Status: AC
  Filled 2020-07-26: qty 10

## 2020-07-26 MED ORDER — CEFAZOLIN SODIUM-DEXTROSE 2-4 GM/100ML-% IV SOLN
2.0000 g | INTRAVENOUS | Status: AC
  Administered 2020-07-26: 2 g via INTRAVENOUS
  Filled 2020-07-26: qty 100

## 2020-07-26 MED ORDER — PHENYLEPHRINE 40 MCG/ML (10ML) SYRINGE FOR IV PUSH (FOR BLOOD PRESSURE SUPPORT)
PREFILLED_SYRINGE | INTRAVENOUS | Status: AC
  Filled 2020-07-26: qty 10

## 2020-07-26 MED ORDER — LIDOCAINE HCL (PF) 2 % IJ SOLN
INTRAMUSCULAR | Status: AC
  Filled 2020-07-26: qty 5

## 2020-07-26 MED ORDER — MIDAZOLAM HCL 5 MG/5ML IJ SOLN
INTRAMUSCULAR | Status: DC | PRN
  Administered 2020-07-26: 2 mg via INTRAVENOUS

## 2020-07-26 MED ORDER — PROPOFOL 10 MG/ML IV BOLUS
INTRAVENOUS | Status: AC
  Filled 2020-07-26: qty 20

## 2020-07-26 MED ORDER — FENTANYL CITRATE (PF) 100 MCG/2ML IJ SOLN
INTRAMUSCULAR | Status: DC | PRN
  Administered 2020-07-26 (×5): 50 ug via INTRAVENOUS

## 2020-07-26 MED ORDER — CHLORHEXIDINE GLUCONATE 0.12 % MT SOLN
15.0000 mL | Freq: Once | OROMUCOSAL | Status: AC
  Administered 2020-07-26: 15 mL via OROMUCOSAL
  Filled 2020-07-26: qty 15

## 2020-07-26 MED ORDER — FENTANYL CITRATE (PF) 100 MCG/2ML IJ SOLN: 25.0000 ug | INTRAMUSCULAR | Status: DC | PRN

## 2020-07-26 MED ORDER — BUPIVACAINE-EPINEPHRINE (PF) 0.5% -1:200000 IJ SOLN
INTRAMUSCULAR | Status: DC | PRN
  Administered 2020-07-26: 30 mL via PERINEURAL

## 2020-07-26 MED ORDER — LIDOCAINE 2% (20 MG/ML) 5 ML SYRINGE
INTRAMUSCULAR | Status: DC | PRN
  Administered 2020-07-26: 100 mg via INTRAVENOUS

## 2020-07-26 MED ORDER — ORAL CARE MOUTH RINSE: 15.0000 mL | Freq: Once | OROMUCOSAL | Status: AC

## 2020-07-26 MED ORDER — PROPOFOL 10 MG/ML IV BOLUS
INTRAVENOUS | Status: DC | PRN
  Administered 2020-07-26: 200 mg via INTRAVENOUS

## 2020-07-26 MED ORDER — DIPHENHYDRAMINE HCL 50 MG/ML IJ SOLN
INTRAMUSCULAR | Status: AC
  Filled 2020-07-26: qty 1

## 2020-07-26 MED ORDER — HYDROCODONE-ACETAMINOPHEN 10-325 MG PO TABS: 1.0000 | ORAL_TABLET | ORAL | 0 refills | Status: DC | PRN

## 2020-07-26 MED ORDER — MIDAZOLAM HCL 2 MG/2ML IJ SOLN
INTRAMUSCULAR | Status: AC
  Filled 2020-07-26: qty 2

## 2020-07-26 MED ORDER — SUGAMMADEX SODIUM 200 MG/2ML IV SOLN
INTRAVENOUS | Status: DC | PRN
  Administered 2020-07-26: 200 mg via INTRAVENOUS

## 2020-07-26 MED ORDER — DEXAMETHASONE SODIUM PHOSPHATE 10 MG/ML IJ SOLN
INTRAMUSCULAR | Status: AC
  Filled 2020-07-26: qty 1

## 2020-07-26 MED ORDER — IBUPROFEN 800 MG PO TABS
ORAL_TABLET | ORAL | Status: AC
  Filled 2020-07-26: qty 1

## 2020-07-26 MED ORDER — IBUPROFEN 800 MG PO TABS: 800.0000 mg | ORAL_TABLET | Freq: Three times a day (TID) | ORAL | 1 refills | Status: DC | PRN

## 2020-07-26 MED ORDER — ONDANSETRON HCL 4 MG/2ML IJ SOLN
INTRAMUSCULAR | Status: DC | PRN
  Administered 2020-07-26: 4 mg via INTRAVENOUS

## 2020-07-26 MED ORDER — SODIUM CHLORIDE 0.9 % IR SOLN
Status: DC | PRN
  Administered 2020-07-26: 1000 mL

## 2020-07-26 MED ORDER — MEPERIDINE HCL 50 MG/ML IJ SOLN: 6.2500 mg | INTRAMUSCULAR | Status: DC | PRN

## 2020-07-26 MED ORDER — BUPIVACAINE-EPINEPHRINE (PF) 0.5% -1:200000 IJ SOLN
INTRAMUSCULAR | Status: AC
  Filled 2020-07-26: qty 30

## 2020-07-26 MED ORDER — EPHEDRINE SULFATE-NACL 50-0.9 MG/10ML-% IV SOSY
PREFILLED_SYRINGE | INTRAVENOUS | Status: DC | PRN
  Administered 2020-07-26: 10 mg via INTRAVENOUS
  Administered 2020-07-26: 5 mg via INTRAVENOUS
  Administered 2020-07-26 (×2): 10 mg via INTRAVENOUS

## 2020-07-26 MED ORDER — ONDANSETRON HCL 4 MG/2ML IJ SOLN: 4.0000 mg | Freq: Once | INTRAMUSCULAR | Status: DC

## 2020-07-26 MED ORDER — KETAMINE HCL 10 MG/ML IJ SOLN
INTRAMUSCULAR | Status: DC | PRN
  Administered 2020-07-26: 20 mg via INTRAVENOUS

## 2020-07-26 MED ORDER — ONDANSETRON HCL 4 MG/2ML IJ SOLN
INTRAMUSCULAR | Status: AC
  Filled 2020-07-26: qty 2

## 2020-07-26 MED ORDER — PHENYLEPHRINE 40 MCG/ML (10ML) SYRINGE FOR IV PUSH (FOR BLOOD PRESSURE SUPPORT)
PREFILLED_SYRINGE | INTRAVENOUS | Status: DC | PRN
  Administered 2020-07-26 (×2): 80 ug via INTRAVENOUS

## 2020-07-26 MED ORDER — ROCURONIUM BROMIDE 10 MG/ML (PF) SYRINGE
PREFILLED_SYRINGE | INTRAVENOUS | Status: DC | PRN
  Administered 2020-07-26: 10 mg via INTRAVENOUS
  Administered 2020-07-26: 60 mg via INTRAVENOUS

## 2020-07-26 MED ORDER — KETAMINE HCL 50 MG/5ML IJ SOSY
PREFILLED_SYRINGE | INTRAMUSCULAR | Status: AC
  Filled 2020-07-26: qty 5

## 2020-07-26 MED ORDER — DIPHENHYDRAMINE HCL 50 MG/ML IJ SOLN
12.5000 mg | Freq: Once | INTRAMUSCULAR | Status: AC
  Administered 2020-07-26: 12.5 mg via INTRAVENOUS

## 2020-07-26 MED ORDER — DEXAMETHASONE SODIUM PHOSPHATE 10 MG/ML IJ SOLN
INTRAMUSCULAR | Status: DC | PRN
  Administered 2020-07-26: 6 mg via INTRAVENOUS

## 2020-07-26 MED ORDER — ROCURONIUM BROMIDE 10 MG/ML (PF) SYRINGE
PREFILLED_SYRINGE | INTRAVENOUS | Status: AC
  Filled 2020-07-26: qty 10

## 2020-07-26 SURGICAL SUPPLY — 63 items
APL PRP STRL LF DISP 70% ISPRP (MISCELLANEOUS) ×1
BANDAGE ELASTIC 4 VELCRO NS (GAUZE/BANDAGES/DRESSINGS) ×4 IMPLANT
BIT DRILL Q/COUPLING 1 (BIT) ×2 IMPLANT
BLADE 10 SAFETY STRL DISP (BLADE) ×4 IMPLANT
BLADE 11 SAFETY STRL DISP (BLADE) ×2 IMPLANT
BLADE HEX COATED 2.75 (ELECTRODE) ×2 IMPLANT
BNDG COHESIVE 4X5 TAN STRL (GAUZE/BANDAGES/DRESSINGS) ×2 IMPLANT
CHLORAPREP W/TINT 26 (MISCELLANEOUS) ×2 IMPLANT
CLOTH BEACON ORANGE TIMEOUT ST (SAFETY) ×2 IMPLANT
COVER LIGHT HANDLE STERIS (MISCELLANEOUS) ×4 IMPLANT
COVER MAYO STAND XLG (MISCELLANEOUS) ×2 IMPLANT
COVER WAND RF STERILE (DRAPES) ×2 IMPLANT
DECANTER SPIKE VIAL GLASS SM (MISCELLANEOUS) ×2 IMPLANT
DRAPE C-ARM FOLDED MOBILE STRL (DRAPES) ×2 IMPLANT
DRAPE EXTREMITY T 121X128X90 (DISPOSABLE) ×2 IMPLANT
DRAPE HALF SHEET 40X57 (DRAPES) ×4 IMPLANT
DRAPE ORTHO 2.5IN SPLIT 77X108 (DRAPES) ×2 IMPLANT
DRAPE ORTHO SPLIT 77X108 STRL (DRAPES) ×4
ELECT REM PT RETURN 9FT ADLT (ELECTROSURGICAL) ×2
ELECTRODE REM PT RTRN 9FT ADLT (ELECTROSURGICAL) ×1 IMPLANT
GAUZE 4X4 16PLY RFD (DISPOSABLE) ×2 IMPLANT
GAUZE SPONGE 4X4 12PLY STRL (GAUZE/BANDAGES/DRESSINGS) ×2 IMPLANT
GAUZE XEROFORM 5X9 LF (GAUZE/BANDAGES/DRESSINGS) ×2 IMPLANT
GLOVE SKINSENSE NS SZ8.0 LF (GLOVE) ×1
GLOVE SKINSENSE STRL SZ8.0 LF (GLOVE) ×1 IMPLANT
GLOVE SS N UNI LF 8.5 STRL (GLOVE) ×2 IMPLANT
GLOVE SURG UNDER POLY LF SZ7 (GLOVE) ×6 IMPLANT
GOWN STRL REUS W/TWL LRG LVL3 (GOWN DISPOSABLE) ×4 IMPLANT
GOWN STRL REUS W/TWL XL LVL3 (GOWN DISPOSABLE) ×2 IMPLANT
INST SET MINOR BONE (KITS) ×2 IMPLANT
KIT BLADEGUARD II DBL (SET/KITS/TRAYS/PACK) ×2 IMPLANT
KIT TURNOVER KIT A (KITS) ×2 IMPLANT
MANIFOLD NEPTUNE II (INSTRUMENTS) ×2 IMPLANT
MARKER SKIN DUAL TIP RULER LAB (MISCELLANEOUS) ×2 IMPLANT
NEEDLE HYPO 21X1.5 SAFETY (NEEDLE) ×2 IMPLANT
NS IRRIG 1000ML POUR BTL (IV SOLUTION) ×2 IMPLANT
PACK BASIC III (CUSTOM PROCEDURE TRAY) ×2
PACK SRG BSC III STRL LF ECLPS (CUSTOM PROCEDURE TRAY) ×1 IMPLANT
PAD ABD 5X9 TENDERSORB (GAUZE/BANDAGES/DRESSINGS) ×4 IMPLANT
PADDING CAST COTTON 6X4 STRL (CAST SUPPLIES) ×4 IMPLANT
PADDING WEBRIL 4 STERILE (GAUZE/BANDAGES/DRESSINGS) ×4 IMPLANT
PENCIL SMOKE EVACUATOR COATED (MISCELLANEOUS) ×2 IMPLANT
PLATE LOCKING 6 HOLE (Plate) ×2 IMPLANT
SCREW CORTEX ST 4.5X24 (Screw) ×2 IMPLANT
SCREW CORTEX ST 4.5X26 (Screw) ×4 IMPLANT
SCREW CORTEX ST 4.5X28 (Screw) ×4 IMPLANT
SCREW CORTEX ST 4.5X30 (Screw) ×2 IMPLANT
SCREW LOCKING 5.0MM 28MM (Screw) ×2 IMPLANT
SET BASIN LINEN APH (SET/KITS/TRAYS/PACK) ×2 IMPLANT
SPLINT J IMMOBILIZER 4X20FT (CAST SUPPLIES) ×1 IMPLANT
SPLINT J PLASTER J 4INX20Y (CAST SUPPLIES) ×1
SPONGE LAP 18X18 RF (DISPOSABLE) ×4 IMPLANT
SPONGE LAP 18X18 X RAY DECT (DISPOSABLE) ×4 IMPLANT
STAPLER VISISTAT 35W (STAPLE) ×2 IMPLANT
STOCKINETTE IMPERVIOUS LG (DRAPES) ×2 IMPLANT
SUT MON AB 0 CT1 (SUTURE) ×2 IMPLANT
SUT MON AB 2-0 SH 27 (SUTURE) ×4
SUT MON AB 2-0 SH27 (SUTURE) ×2 IMPLANT
SYR 30ML LL (SYRINGE) ×2 IMPLANT
SYR BULB IRRIG 60ML STRL (SYRINGE) ×4 IMPLANT
TOWEL OR 17X26 4PK STRL BLUE (TOWEL DISPOSABLE) ×2 IMPLANT
VESSEL LOOPS MAXI RED (MISCELLANEOUS) ×2 IMPLANT
YANKAUER SUCT BULB TIP 10FT TU (MISCELLANEOUS) ×2 IMPLANT

## 2020-07-26 NOTE — Op Note (Signed)
07/26/2020  10:08 AM  PATIENT:  Lenise Herald  60 y.o. female  PRE-OPERATIVE DIAGNOSIS:  left humeral shaft fracture, delayed union, malunion  POST-OPERATIVE DIAGNOSIS:  left humeral shaft fracture, delayed union, malunion  PROCEDURE:  Procedure(s) with comments: OPEN REDUCTION INTERNAL FIXATION (ORIF) HUMERAL SHAFT FRACTURE WITH AUTOGRAFT (Left) - pt knows to arrive at 6:15  Implants Synthes four-point five 6-hole plate  5 regular screws one locking screw  Autograft from the fracture site  Assisted by Canary Brim  No complications  Postop 30 cc of Marcaine with epinephrine in the skin only  SURGEON:  Surgeon(s) and Role:    Vickki Hearing, MD - Primary  Ms. Alinda Money was seen in preop and the surgical site was confirmed marked chart review was completed instruments were checked radiographs were available for review  She was then taken to the operating for general anesthesia.  She was in supine position with an arm table.  The C arm came in from the top at the monitor to the left of the C arm and the scrub tech and instruments at the end of the hand table surgeon in the axilla.  After sterile prep and drape timeout was taken site was confirmed 2 g of antibiotics were given  We did an anterolateral approach to the humerus with extension into the deltopectoral interval  Subcutaneous tissue was divided there were several subcutaneous bleeders which were coagulated.  Full-thickness skin flaps were taken down to the biceps.  The interval between the biceps and the brachialis was explored including the interval between the brachialis and the brachial radialis, the radial nerve was identified dissected free and tagged.  The musculotendinous nerve was seen behind the biceps.  The lateral third and medial two thirds of the brachialis muscle was split until we came down to bone.  We took this up to the fracture site.  We then extended the incision into the deltopectoral interval  preserving the cephalic vein and identifying the fracture.  There was partial healing of the fracture with some bone formation but the fracture was still very mobile.  The lateral side was the tension side and therefore that was plated.  We contoured the plate slightly with a plate bender and then took down the fracture site and took x-rays with a 6-hole plate provisionally fixed to the bone.  Once the fracture site was taken down we took that and prepared it for bone graft.  We applied a 6-hole plate in compression mode.  Radiographs confirmed fracture reduction which was showing much improved alignment.  We then irrigated check for any bleeding and then applied the bone graft medially anteriorly and posterior laterally.  Skin was closed with 2-0 Monocryl staples were applied to the skin edges sterile dressings and posterior splint was applied patient was placed in a sling.  We again checked the musculocutaneous nerve and the radial nerve.  The radial nerve lies at the lateral side of the plate posterior to to it in case there is ever any need to remove the plate   ANESTHESIA:   general  EBL:  50cc   BLOOD ADMINISTERED:none  DRAINS: none    SPECIMEN:  No Specimen  DISPOSITION OF SPECIMEN:  N/A  COUNTS:  YES  TOURNIQUET:  * No tourniquets in log *  DICTATION: .Dragon Dictation  PLAN OF CARE: Discharge to home after PACU  PATIENT DISPOSITION:  PACU - hemodynamically stable.   Delay start of Pharmacological VTE agent (>24hrs) due to surgical  blood loss or risk of bleeding: no

## 2020-07-26 NOTE — Brief Op Note (Signed)
07/26/2020  10:08 AM  PATIENT:  Margaret Gonzalez  60 y.o. female  PRE-OPERATIVE DIAGNOSIS:  left humeral shaft fracture, delayed union, malunion  POST-OPERATIVE DIAGNOSIS:  left humeral shaft fracture, delayed union, malunion  PROCEDURE:  Procedure(s) with comments: OPEN REDUCTION INTERNAL FIXATION (ORIF) HUMERAL SHAFT FRACTURE WITH AUTOGRAFT (Left) - pt knows to arrive at 6:15  Implants Synthes four-point five 6-hole plate  5 regular screws one locking screw  Autograft from the fracture site  Assisted by Cynthia Wren  No complications  Postop 30 cc of Marcaine with epinephrine in the skin only  SURGEON:  Surgeon(s) and Role:    * Emoree Sasaki E, MD - Primary  Ms. Tony was seen in preop and the surgical site was confirmed marked chart review was completed instruments were checked radiographs were available for review  She was then taken to the operating for general anesthesia.  She was in supine position with an arm table.  The C arm came in from the top at the monitor to the left of the C arm and the scrub tech and instruments at the end of the hand table surgeon in the axilla.  After sterile prep and drape timeout was taken site was confirmed 2 g of antibiotics were given  We did an anterolateral approach to the humerus with extension into the deltopectoral interval  Subcutaneous tissue was divided there were several subcutaneous bleeders which were coagulated.  Full-thickness skin flaps were taken down to the biceps.  The interval between the biceps and the brachialis was explored including the interval between the brachialis and the brachial radialis, the radial nerve was identified dissected free and tagged.  The musculotendinous nerve was seen behind the biceps.  The lateral third and medial two thirds of the brachialis muscle was split until we came down to bone.  We took this up to the fracture site.  We then extended the incision into the deltopectoral interval  preserving the cephalic vein and identifying the fracture.  There was partial healing of the fracture with some bone formation but the fracture was still very mobile.  The lateral side was the tension side and therefore that was plated.  We contoured the plate slightly with a plate bender and then took down the fracture site and took x-rays with a 6-hole plate provisionally fixed to the bone.  Once the fracture site was taken down we took that and prepared it for bone graft.  We applied a 6-hole plate in compression mode.  Radiographs confirmed fracture reduction which was showing much improved alignment.  We then irrigated check for any bleeding and then applied the bone graft medially anteriorly and posterior laterally.  Skin was closed with 2-0 Monocryl staples were applied to the skin edges sterile dressings and posterior splint was applied patient was placed in a sling.  We again checked the musculocutaneous nerve and the radial nerve.  The radial nerve lies at the lateral side of the plate posterior to to it in case there is ever any need to remove the plate   ANESTHESIA:   general  EBL:  50cc   BLOOD ADMINISTERED:none  DRAINS: none    SPECIMEN:  No Specimen  DISPOSITION OF SPECIMEN:  N/A  COUNTS:  YES  TOURNIQUET:  * No tourniquets in log *  DICTATION: .Dragon Dictation  PLAN OF CARE: Discharge to home after PACU  PATIENT DISPOSITION:  PACU - hemodynamically stable.   Delay start of Pharmacological VTE agent (>24hrs) due to surgical   blood loss or risk of bleeding: no

## 2020-07-26 NOTE — Progress Notes (Signed)
Instructed on incentive spirometer. 1500 ml obtained. Tolerated well. 

## 2020-07-26 NOTE — Anesthesia Postprocedure Evaluation (Signed)
Anesthesia Post Note  Patient: Margaret Gonzalez  Procedure(s) Performed: OPEN REDUCTION INTERNAL FIXATION (ORIF) HUMERAL SHAFT FRACTURE WITH AUTOGRAFT (Left Arm Upper)  Patient location during evaluation: PACU Anesthesia Type: General Level of consciousness: awake and alert and oriented Pain management: pain level controlled Vital Signs Assessment: post-procedure vital signs reviewed and stable Respiratory status: spontaneous breathing and respiratory function stable Cardiovascular status: blood pressure returned to baseline and stable Postop Assessment: no apparent nausea or vomiting Anesthetic complications: no   No complications documented.   Last Vitals:  Vitals:   07/26/20 1045 07/26/20 1117  BP: 116/71 129/79  Pulse: 92   Resp: 12 16  Temp:  36.8 C  SpO2: 96% 95%    Last Pain:  Vitals:   07/26/20 1117  TempSrc: Oral  PainSc: 5                  Aikeem Lilley C Chantelle Verdi

## 2020-07-26 NOTE — Anesthesia Procedure Notes (Signed)
Procedure Name: Intubation Date/Time: 07/26/2020 7:39 AM Performed by: Julian Reil, CRNA Pre-anesthesia Checklist: Patient identified, Emergency Drugs available, Suction available and Patient being monitored Patient Re-evaluated:Patient Re-evaluated prior to induction Oxygen Delivery Method: Circle system utilized Preoxygenation: Pre-oxygenation with 100% oxygen Induction Type: IV induction Ventilation: Mask ventilation without difficulty Laryngoscope Size: Miller and 3 Grade View: Grade I Tube type: Oral Tube size: 7.0 mm Number of attempts: 1 Airway Equipment and Method: Stylet Placement Confirmation: ETT inserted through vocal cords under direct vision,  positive ETCO2 and breath sounds checked- equal and bilateral Secured at: 21 cm Tube secured with: Tape Dental Injury: Teeth and Oropharynx as per pre-operative assessment  Comments: 4x4s bite block used.

## 2020-07-26 NOTE — Brief Op Note (Signed)
07/26/2020  9:57 AM  PATIENT:  Margaret Gonzalez  60 y.o. female  PRE-OPERATIVE DIAGNOSIS:  left humeral shaft fracture, delayed union, malunion  POST-OPERATIVE DIAGNOSIS:  left humeral shaft fracture, delayed union, malunion  PROCEDURE:  Procedure(s) with comments: OPEN REDUCTION INTERNAL FIXATION (ORIF) HUMERAL SHAFT FRACTURE WITH AUTOGRAFT (Left) - pt knows to arrive at 6:15  Implants Synthes four-point five 6-hole plate  5 regular screws one locking screw  Autograft from the fracture site  Assisted by Canary Brim  No complications  Postop 30 cc of Marcaine with epinephrine in the skin only  SURGEON:  Surgeon(s) and Role:    Vickki Hearing, MD - Primary  Ms. Alinda Money was seen in preop and the surgical site was confirmed marked chart review was completed instruments were checked radiographs were available for review  She was then taken to the operating for general anesthesia.  She was in supine position with an arm table.  The C arm came in from the top at the monitor to the left of the C arm and the scrub tech and instruments at the end of the hand table surgeon in the axilla.  After sterile prep and drape timeout was taken site was confirmed 2 g of antibiotics were given  We did an anterolateral approach to the humerus with extension into the deltopectoral interval  Subcutaneous tissue was divided there were several subcutaneous bleeders which were coagulated.  Full-thickness skin flaps were taken down to the biceps.  The interval between the biceps and the brachialis was explored including the interval between the brachialis and the brachial radialis, the radial nerve was identified dissected free and tagged.  The musculotendinous nerve was seen behind the biceps.  The lateral third and medial two thirds of the brachialis muscle was split until we came down to bone.  We took this up to the fracture site.  We then extended the incision into the deltopectoral interval preserving  the cephalic vein and identifying the fracture.  There was partial healing of the fracture with some bone formation but the fracture was still very mobile.  The lateral side was the tension side and therefore that was plated.  We contoured the plate slightly with a plate bender and then took down the fracture site and took x-rays with a 6-hole plate provisionally fixed to the bone.  Once the fracture site was taken down we took that and prepared it for bone graft.  We applied a 6-hole plate in compression mode.  Radiographs confirmed fracture reduction which was showing much improved alignment.  We then irrigated check for any bleeding and then applied the bone graft medially anteriorly and posterior laterally.  Skin was closed with 2-0 Monocryl staples were applied to the skin edges sterile dressings and posterior splint was applied patient was placed in a sling.  We again checked the musculocutaneous nerve and the radial nerve.  The radial nerve lies at the lateral side of the plate posterior to to it in case there is ever any need to remove the plate   ANESTHESIA:   general  EBL:  50cc   BLOOD ADMINISTERED:none  DRAINS: none    SPECIMEN:  No Specimen  DISPOSITION OF SPECIMEN:  N/A  COUNTS:  YES  TOURNIQUET:  * No tourniquets in log *  DICTATION: .Dragon Dictation  PLAN OF CARE: Discharge to home after PACU  PATIENT DISPOSITION:  PACU - hemodynamically stable.   Delay start of Pharmacological VTE agent (>24hrs) due to surgical  blood loss or risk of bleeding: no

## 2020-07-26 NOTE — Anesthesia Preprocedure Evaluation (Signed)
Anesthesia Evaluation  Patient identified by MRN, date of birth, ID band Patient awake    Reviewed: Allergy & Precautions, NPO status , Patient's Chart, lab work & pertinent test results  History of Anesthesia Complications Negative for: history of anesthetic complications  Airway Mallampati: II  TM Distance: >3 FB Neck ROM: Full    Dental  (+) Dental Advisory Given, Missing, Chipped,    Pulmonary neg pulmonary ROS,    Pulmonary exam normal breath sounds clear to auscultation       Cardiovascular Exercise Tolerance: Good hypertension, Pt. on medications Normal cardiovascular exam Rhythm:Regular Rate:Normal     Neuro/Psych  Headaches, PSYCHIATRIC DISORDERS Anxiety Depression    GI/Hepatic negative GI ROS, Neg liver ROS,   Endo/Other  diabetes (prediabetic)  Renal/GU negative Renal ROS     Musculoskeletal  (+) Arthritis , Left humeral shaft fx   Abdominal   Peds  Hematology negative hematology ROS (+) anemia ,   Anesthesia Other Findings   Reproductive/Obstetrics                             Anesthesia Physical  Anesthesia Plan  ASA: II  Anesthesia Plan: General   Post-op Pain Management:    Induction: Intravenous  PONV Risk Score and Plan: 3 and Ondansetron, Midazolam and Dexamethasone  Airway Management Planned: Oral ETT  Additional Equipment:   Intra-op Plan:   Post-operative Plan: Extubation in OR  Informed Consent: I have reviewed the patients History and Physical, chart, labs and discussed the procedure including the risks, benefits and alternatives for the proposed anesthesia with the patient or authorized representative who has indicated his/her understanding and acceptance.     Dental advisory given  Plan Discussed with: CRNA and Surgeon  Anesthesia Plan Comments:         Anesthesia Quick Evaluation

## 2020-07-26 NOTE — Transfer of Care (Signed)
Immediate Anesthesia Transfer of Care Note  Patient: Margaret Gonzalez  Procedure(s) Performed: OPEN REDUCTION INTERNAL FIXATION (ORIF) HUMERAL SHAFT FRACTURE WITH AUTOGRAFT (Left Arm Upper)  Patient Location: PACU  Anesthesia Type:General  Level of Consciousness: awake, alert  and oriented  Airway & Oxygen Therapy: Patient Spontanous Breathing and Patient connected to nasal cannula oxygen  Post-op Assessment: Report given to RN and Post -op Vital signs reviewed and stable  Post vital signs: Reviewed and stable  Last Vitals:  Vitals Value Taken Time  BP 136/74 07/26/20 1000  Temp    Pulse 94 07/26/20 1001  Resp 15 07/26/20 1001  SpO2 100 % 07/26/20 1001  Vitals shown include unvalidated device data.  Last Pain:  Vitals:   07/26/20 0652  PainSc: 7          Complications: No complications documented.

## 2020-07-26 NOTE — Interval H&P Note (Signed)
History and Physical Interval Note:  07/26/2020 7:25 AM  Margaret Gonzalez  has presented today for surgery, with the diagnosis of fracture left humerus.  The various methods of treatment have been discussed with the patient and family. After consideration of risks, benefits and other options for treatment, the patient has consented to  Procedure(s) with comments: OPEN REDUCTION INTERNAL FIXATION (ORIF) HUMERAL SHAFT FRACTURE (Left) - pt knows to arrive at 6:15 as a surgical intervention.  The patient's history has been reviewed, patient examined, no change in status, stable for surgery.  I have reviewed the patient's chart and labs.  Questions were answered to the patient's satisfaction.     [name]

## 2020-07-27 ENCOUNTER — Encounter (HOSPITAL_COMMUNITY): Payer: Self-pay | Admitting: Orthopedic Surgery

## 2020-08-03 ENCOUNTER — Other Ambulatory Visit: Payer: Self-pay

## 2020-08-03 ENCOUNTER — Ambulatory Visit (INDEPENDENT_AMBULATORY_CARE_PROVIDER_SITE_OTHER): Payer: Self-pay | Admitting: Orthopedic Surgery

## 2020-08-03 VITALS — Ht 65.0 in | Wt 200.0 lb

## 2020-08-03 DIAGNOSIS — S42322D Displaced transverse fracture of shaft of humerus, left arm, subsequent encounter for fracture with routine healing: Secondary | ICD-10-CM

## 2020-08-03 NOTE — Progress Notes (Signed)
Postop visit #1  Chief Complaint  Patient presents with  . Follow-up    Recheck on left humerus, DOS 07-26-20.    Status post ORIF left humerus after failure to progress with bracing and splinting  She is postop day #8  Doing well minimal discomfort all nerves are working  Wound looks great  Patient placed in humeral fracture cuff to come back in a week for x-rays and staple removal

## 2020-08-08 ENCOUNTER — Ambulatory Visit: Payer: Self-pay | Admitting: Orthopedic Surgery

## 2020-08-09 ENCOUNTER — Encounter: Payer: Self-pay | Admitting: Orthopedic Surgery

## 2020-08-09 ENCOUNTER — Ambulatory Visit: Payer: Self-pay

## 2020-08-09 ENCOUNTER — Other Ambulatory Visit: Payer: Self-pay

## 2020-08-09 ENCOUNTER — Ambulatory Visit (INDEPENDENT_AMBULATORY_CARE_PROVIDER_SITE_OTHER): Payer: Self-pay | Admitting: Orthopedic Surgery

## 2020-08-09 DIAGNOSIS — S42322D Displaced transverse fracture of shaft of humerus, left arm, subsequent encounter for fracture with routine healing: Secondary | ICD-10-CM

## 2020-08-09 NOTE — Progress Notes (Signed)
Chief Complaint  Patient presents with  . Post-op Follow-up    07/26/20 left humerus staples removed    POST OP APPT   Date of original injury February 22  Date of surgery March 29  ORIF left humerus with bone graft autograft and plate  Radial nerve function is intact wound is clean dry and intact elbow flexion is just short of 90 degrees  X-ray shows good fracture alignment  X-ray again in 4 weeks start shoulder exercises with pendulums continue elbow and hand exercises  Encounter Diagnosis  Name Primary?  . Closed displaced transverse fracture of shaft of left humerus with routine healing, subsequent encounter 06/21/20 ORIF 07/26/20 Yes

## 2020-08-29 ENCOUNTER — Telehealth: Payer: Self-pay | Admitting: Orthopedic Surgery

## 2020-08-29 NOTE — Telephone Encounter (Signed)
Morrie Sheldon the pharmacist from [place] wants to speak to someone regarding fill a prescription for Hydrocodone/Acetaminophen 10-325 mgs. For this patient.  Would you please call Morrie Sheldon at [phone]?  Thanks so much

## 2020-08-29 NOTE — Telephone Encounter (Signed)
She had surgery on 07/26/20 just getting RX filled now, advised she has been using sparingly, ok to get filled now.

## 2020-09-01 ENCOUNTER — Telehealth: Payer: Self-pay | Admitting: Orthopedic Surgery

## 2020-09-01 NOTE — Telephone Encounter (Signed)
Patient called to reschedule her upcoming post op appointment for 09/05/20; relays that she has tested positive for covid-19. Reschedule date is 09/15/20. Please let patient know if any other advice.

## 2020-09-01 NOTE — Telephone Encounter (Signed)
No other advise Thanks for letting us know.

## 2020-09-05 ENCOUNTER — Encounter: Payer: Self-pay | Admitting: Orthopedic Surgery

## 2020-09-15 ENCOUNTER — Encounter: Payer: Self-pay | Admitting: Orthopedic Surgery

## 2020-09-15 ENCOUNTER — Other Ambulatory Visit: Payer: Self-pay

## 2020-09-15 ENCOUNTER — Ambulatory Visit (INDEPENDENT_AMBULATORY_CARE_PROVIDER_SITE_OTHER): Payer: Self-pay | Admitting: Orthopedic Surgery

## 2020-09-15 ENCOUNTER — Ambulatory Visit: Payer: Self-pay

## 2020-09-15 VITALS — Ht 65.0 in | Wt 216.0 lb

## 2020-09-15 DIAGNOSIS — S42322D Displaced transverse fracture of shaft of humerus, left arm, subsequent encounter for fracture with routine healing: Secondary | ICD-10-CM

## 2020-09-15 MED ORDER — HYDROCODONE-ACETAMINOPHEN 7.5-325 MG PO TABS: 1.0000 | ORAL_TABLET | Freq: Four times a day (QID) | ORAL | 0 refills | Status: AC | PRN

## 2020-09-15 MED ORDER — HYDROCODONE-ACETAMINOPHEN 7.5-325 MG PO TABS: 1.0000 | ORAL_TABLET | Freq: Four times a day (QID) | ORAL | 0 refills | Status: DC | PRN

## 2020-09-15 MED ORDER — IBUPROFEN 800 MG PO TABS: 800.0000 mg | ORAL_TABLET | Freq: Three times a day (TID) | ORAL | 1 refills | Status: DC | PRN

## 2020-09-15 NOTE — Progress Notes (Addendum)
Chief Complaint  Patient presents with  . Routine Post Op    DOI 06/21/20 DOS: 07/01/20 ,ORIF 07/26/20    Post op   7 weeks   xrays no hardware complications alignment maintained callus formation noted on x-ray across 2 of the 4 cortices  C/o shoulder pain   + impingement at 120 degress   I tried to get her to get an injection in the shoulder but she declined worried about cost.  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 DISCONTD: ibuprofen (ADVIL) 800 MG tablet    Sig: Take 1 tablet (800 mg total) by mouth every 8 (eight) hours as needed.    Dispense:  90 tablet    Refill:  1  . DISCONTD: HYDROcodone-acetaminophen (NORCO) 7.5-325 MG tablet    Sig: Take 1 tablet by mouth every 6 (six) hours as needed for moderate pain.    Dispense:  28 tablet    Refill:  0  . HYDROcodone-acetaminophen (NORCO) 7.5-325 MG tablet    Sig: Take 1 tablet by mouth every 6 (six) hours as needed for moderate pain.    Dispense:  28 tablet    Refill:  0

## 2020-10-20 ENCOUNTER — Other Ambulatory Visit: Payer: Self-pay

## 2020-10-20 ENCOUNTER — Encounter: Payer: Self-pay | Admitting: Orthopedic Surgery

## 2020-10-20 ENCOUNTER — Ambulatory Visit (INDEPENDENT_AMBULATORY_CARE_PROVIDER_SITE_OTHER): Payer: Self-pay | Admitting: Orthopedic Surgery

## 2020-10-20 ENCOUNTER — Ambulatory Visit: Payer: Self-pay

## 2020-10-20 DIAGNOSIS — S42322D Displaced transverse fracture of shaft of humerus, left arm, subsequent encounter for fracture with routine healing: Secondary | ICD-10-CM

## 2020-10-20 NOTE — Progress Notes (Signed)
           DOI 06/21/20 DOS: 07/01/20 ,ORIF 07/26/20    POST OP   Chief Complaint  Patient presents with   Follow-up    Recheck on left humerus, DOS 07-26-20    Encounter Diagnosis  Name Primary?   Closed displaced transverse fracture of shaft of left humerus with routine healing, subsequent encounter 06/21/20 ORIF 07/26/20 Yes    XRAYS FRX HEALED   SHES DOING WELL NOW   THE SHOULDER PAIN IS MUCH BETTER AND THE RADIAL NERVE IS INTACT  ROM IS NEARLY NORMAL ONLY LACKING 15 OF SHOULDER FLEXION   SHE CAN RETURN TO WORK AND SHE CAN DO HOME EX PROG   FU PRN

## 2020-10-20 NOTE — Patient Instructions (Signed)
Strengthening exercises

## 2021-08-31 IMAGING — RF DG C-ARM 1-60 MIN
1 series · 7 of 7 positions shown · non-contrast
Comparison: Prior radiographs of the left humerus 07/20/2020.

CLINICAL DATA: Closed fracture of shaft of left humerus. Additional
history provided: ORIF left humerus. Provided fluoroscopy time 14
seconds.

EXAM:
LEFT HUMERUS - 2+ VIEW; DG C-ARM 1-60 MIN

[Series 1: run · 7 of 7 slices shown]
[im 1/7]
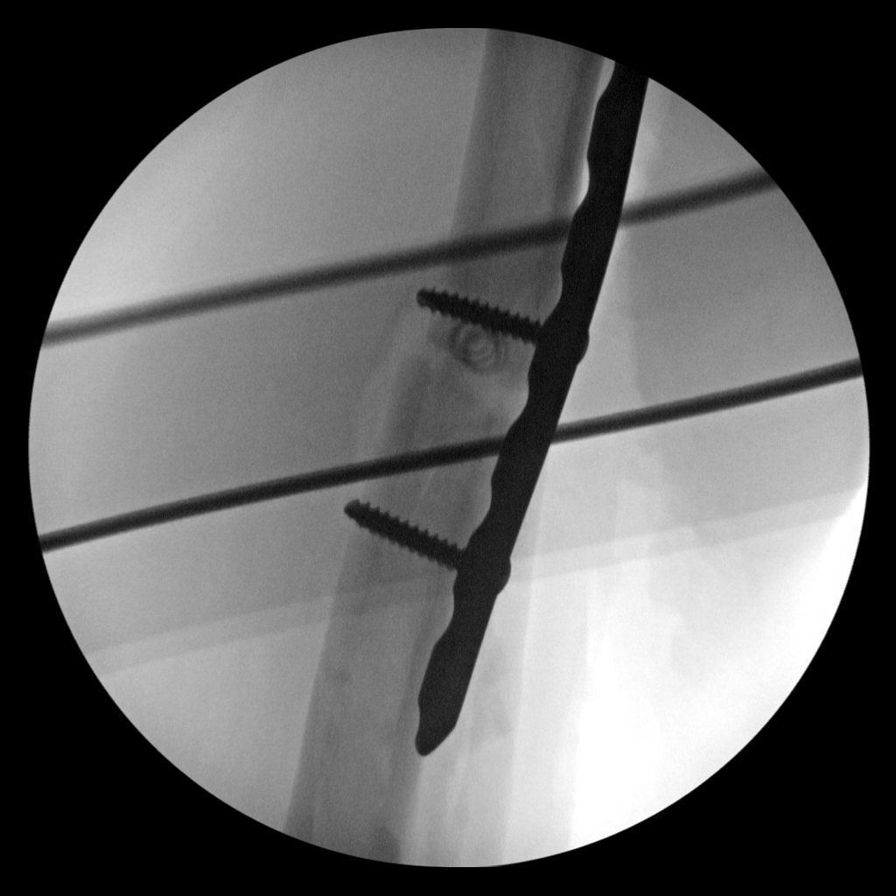
[im 2/7]
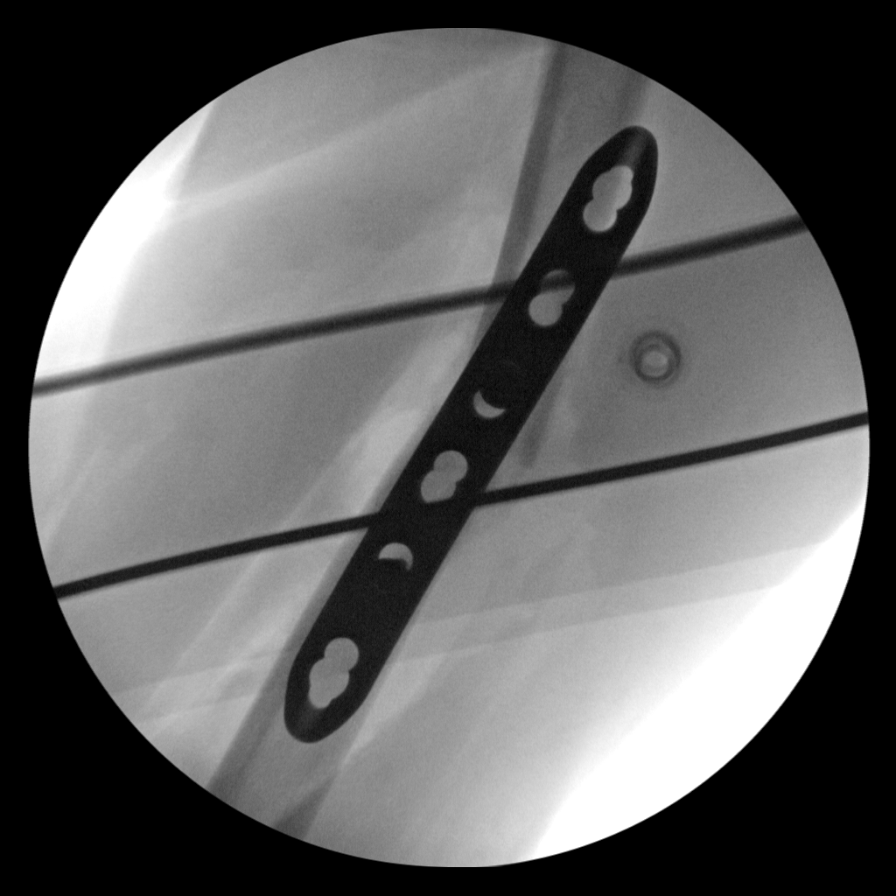
[im 3/7]
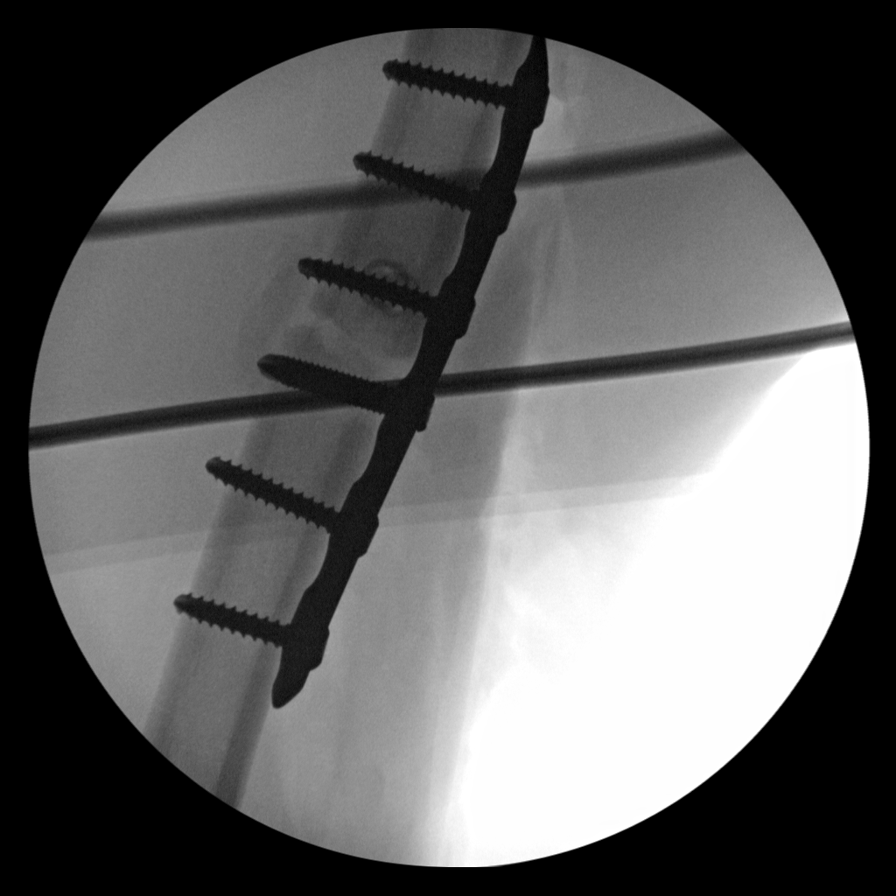
[im 4/7]
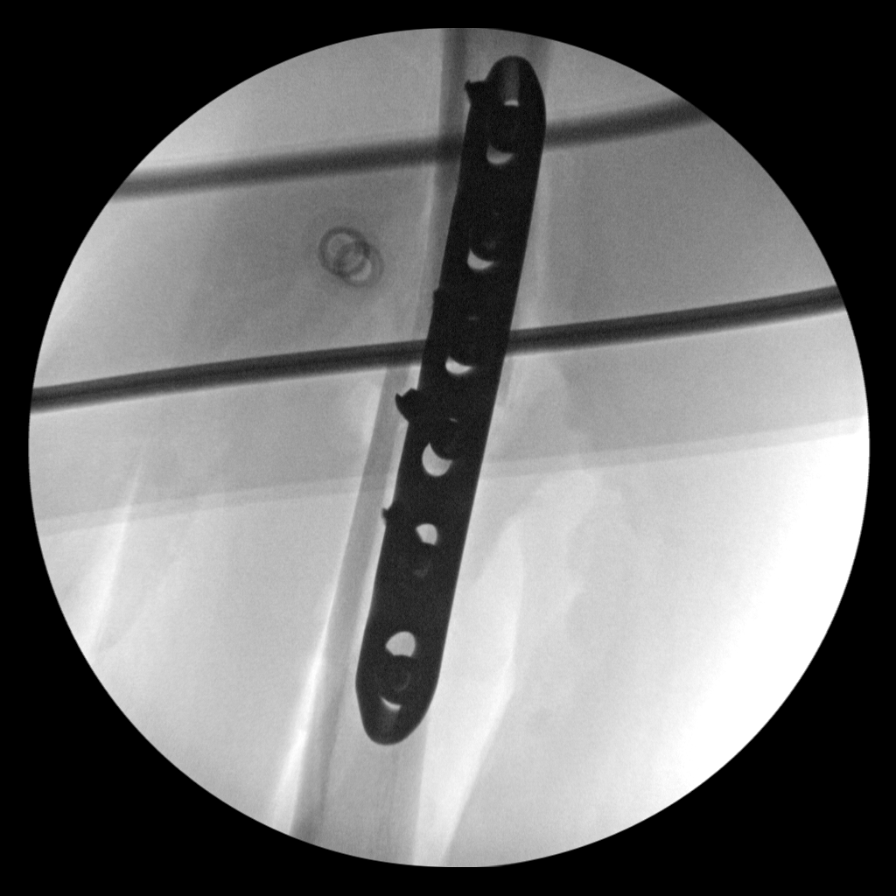
[im 5/7]
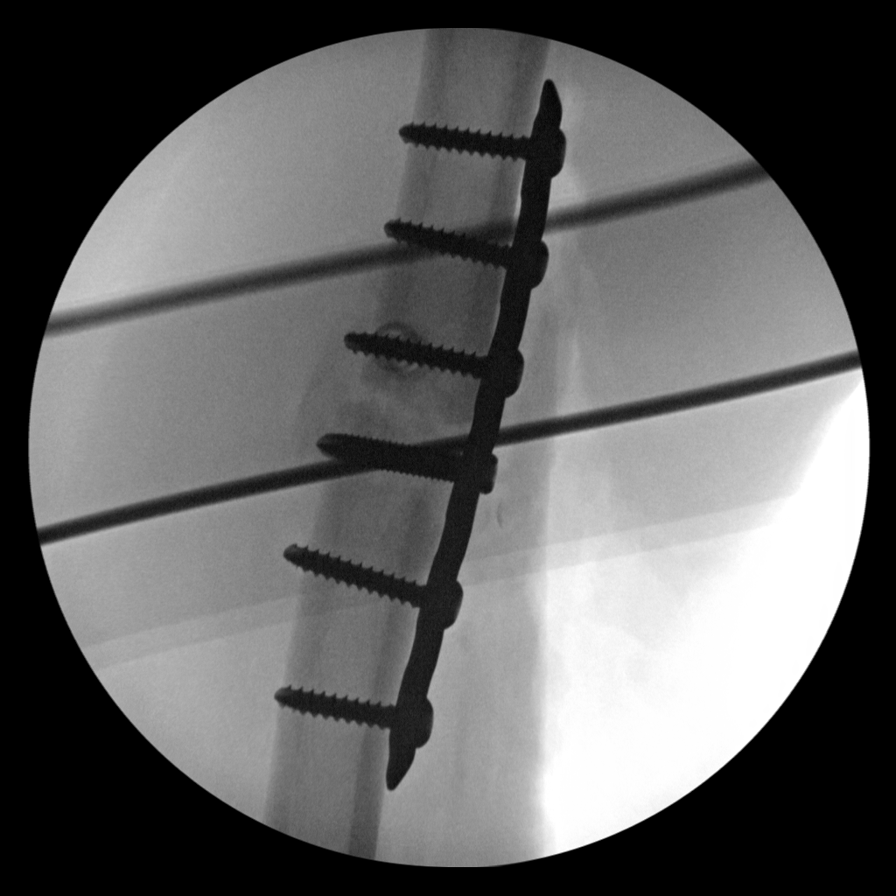
[im 6/7]
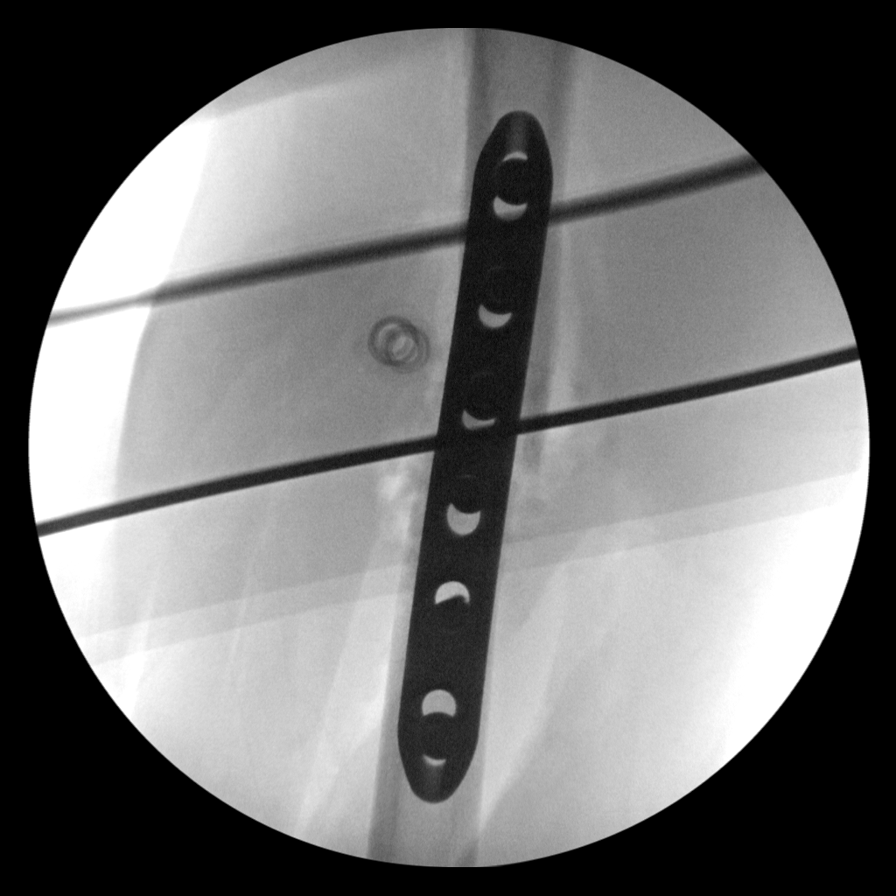
[im 7/7]
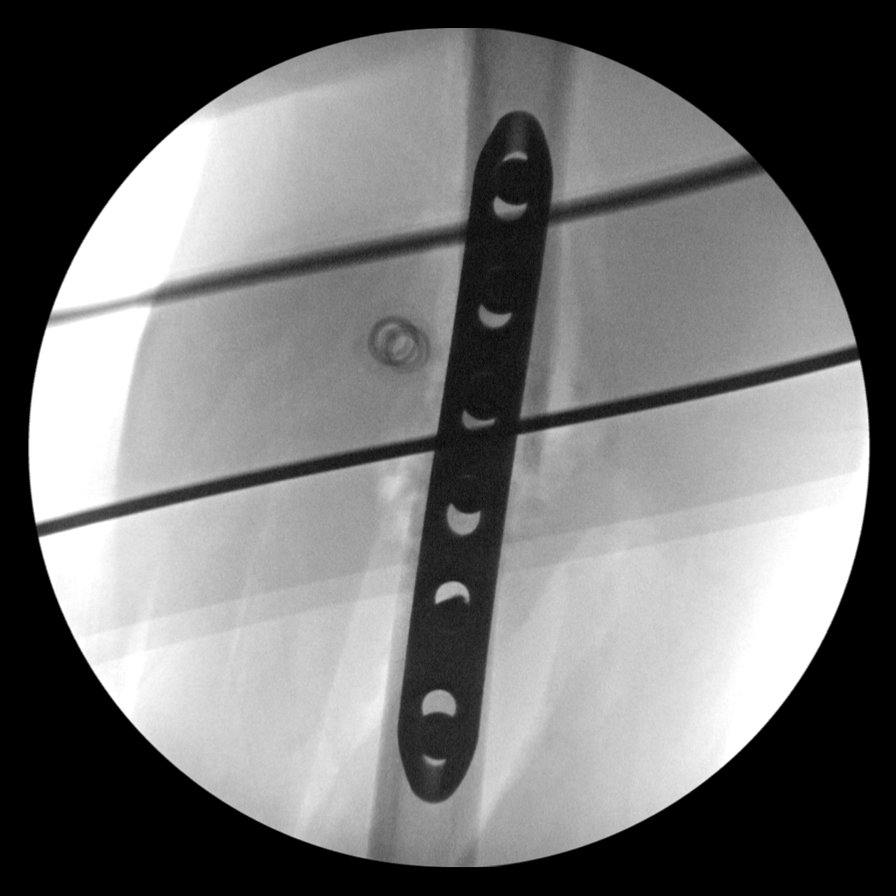

[7 of 7 positions shown; findings below may reference images not displayed]

FINDINGS: Six intraoperative fluoroscopic images of the left humerus are
submitted. The images demonstrate sequela of open reduction and
internal fixation of a humeral shaft fracture, utilizing a plate and
screws. Persistent, although significantly improved, alignment at
the fracture site.
IMPRESSION: Six intraoperative fluoroscopic images of the left humerus from
ORIF, as described.

## 2021-10-23 ENCOUNTER — Other Ambulatory Visit: Payer: Self-pay

## 2021-10-23 MED ORDER — LOSARTAN POTASSIUM-HCTZ 100-25 MG PO TABS
ORAL_TABLET | ORAL | 0 refills | Status: AC
  Filled 2021-10-23: qty 90, 90d supply, fill #0

## 2021-10-23 MED ORDER — AMLODIPINE BESYLATE 10 MG PO TABS
ORAL_TABLET | ORAL | 0 refills | Status: AC
  Filled 2021-10-23: qty 90, 90d supply, fill #0

## 2021-10-23 MED ORDER — BUPROPION HCL ER (XL) 150 MG PO TB24
ORAL_TABLET | ORAL | 0 refills | Status: AC
Start: 2021-10-22 — End: ?
  Filled 2021-10-23: qty 90, 90d supply, fill #0

## 2021-10-23 MED ORDER — CLONAZEPAM 1 MG PO TABS
0.5000 mg | ORAL_TABLET | Freq: Every day | ORAL | 0 refills | Status: AC | PRN
  Filled 2021-10-23: qty 45, 90d supply, fill #0

## 2021-10-23 MED ORDER — CITALOPRAM HYDROBROMIDE 20 MG PO TABS
ORAL_TABLET | ORAL | 0 refills | Status: AC
  Filled 2021-10-23: qty 90, 90d supply, fill #0

## 2021-10-25 ENCOUNTER — Other Ambulatory Visit: Payer: Self-pay

## 2021-10-25 MED ORDER — OZEMPIC (0.25 OR 0.5 MG/DOSE) 2 MG/3ML ~~LOC~~ SOPN
PEN_INJECTOR | SUBCUTANEOUS | 0 refills | Status: DC
  Filled 2021-10-25: qty 3, 30d supply, fill #0
  Filled 2021-11-01: qty 3, 28d supply, fill #0

## 2021-10-27 ENCOUNTER — Other Ambulatory Visit: Payer: Self-pay

## 2021-11-01 ENCOUNTER — Other Ambulatory Visit: Payer: Self-pay

## 2021-11-02 ENCOUNTER — Other Ambulatory Visit: Payer: Self-pay

## 2021-12-10 ENCOUNTER — Other Ambulatory Visit: Payer: Self-pay

## 2021-12-12 ENCOUNTER — Other Ambulatory Visit: Payer: Self-pay

## 2021-12-12 MED ORDER — OZEMPIC (0.25 OR 0.5 MG/DOSE) 2 MG/3ML ~~LOC~~ SOPN
PEN_INJECTOR | SUBCUTANEOUS | 0 refills | Status: DC
Start: 2021-12-11 — End: 2022-02-15
  Filled 2021-12-12: qty 3, 28d supply, fill #0
  Filled 2022-01-11: qty 3, 28d supply, fill #1

## 2022-01-11 ENCOUNTER — Other Ambulatory Visit: Payer: Self-pay

## 2022-01-12 ENCOUNTER — Other Ambulatory Visit: Payer: Self-pay

## 2022-02-14 ENCOUNTER — Other Ambulatory Visit: Payer: Self-pay

## 2022-02-15 ENCOUNTER — Other Ambulatory Visit: Payer: Self-pay

## 2022-02-15 MED ORDER — OZEMPIC (0.25 OR 0.5 MG/DOSE) 2 MG/3ML ~~LOC~~ SOPN
PEN_INJECTOR | SUBCUTANEOUS | 0 refills | Status: DC
  Filled 2022-02-15: qty 3, 28d supply, fill #0
  Filled 2022-03-16: qty 3, 28d supply, fill #1

## 2022-02-16 ENCOUNTER — Other Ambulatory Visit: Payer: Self-pay

## 2022-03-06 ENCOUNTER — Other Ambulatory Visit: Payer: Self-pay

## 2022-03-06 MED ORDER — BUPROPION HCL ER (XL) 150 MG PO TB24
ORAL_TABLET | ORAL | 0 refills | Status: AC
  Filled 2022-03-06: qty 90, 90d supply, fill #0

## 2022-03-06 MED ORDER — CITALOPRAM HYDROBROMIDE 20 MG PO TABS
ORAL_TABLET | ORAL | 0 refills | Status: AC
  Filled 2022-03-06: qty 90, 90d supply, fill #0

## 2022-03-06 MED ORDER — LOSARTAN POTASSIUM-HCTZ 100-25 MG PO TABS
ORAL_TABLET | ORAL | 0 refills | Status: AC
  Filled 2022-03-06: qty 90, 90d supply, fill #0

## 2022-03-06 MED ORDER — AMLODIPINE BESYLATE 10 MG PO TABS
ORAL_TABLET | ORAL | 0 refills | Status: AC
  Filled 2022-03-06: qty 90, 90d supply, fill #0

## 2022-03-14 ENCOUNTER — Ambulatory Visit
Admission: RE | Admit: 2022-03-14 | Discharge: 2022-03-14 | Disposition: A | Discharge status: Home / Self Care | Payer: No Typology Code available for payment source | Source: Ambulatory Visit | Attending: Nurse Practitioner | Admitting: Nurse Practitioner

## 2022-03-14 VITALS — BP 145/90 | HR 85 | Temp 98.2°F | Resp 18

## 2022-03-14 DIAGNOSIS — J069 Acute upper respiratory infection, unspecified: Secondary | ICD-10-CM | POA: Diagnosis not present

## 2022-03-14 DIAGNOSIS — Z1152 Encounter for screening for COVID-19: Secondary | ICD-10-CM | POA: Diagnosis not present

## 2022-03-14 MED ORDER — BENZONATATE 100 MG PO CAPS: 100.0000 mg | ORAL_CAPSULE | Freq: Three times a day (TID) | ORAL | 0 refills | Status: AC | PRN

## 2022-03-14 NOTE — ED Notes (Signed)
Pt called in lobby, no answered.   Left a voice note to phone.

## 2022-03-14 NOTE — Discharge Instructions (Signed)
You have a viral upper respiratory infection.  Symptoms should improve over the next week to 10 days.  If you develop chest pain or shortness of breath, go to the emergency room.  We have tested you today for COVID-19.  You will see the results in Mychart and we will call you with positive results.  Please stay home and isolate until you are aware of the results.    Some things that can make you feel better are: - Increased rest - Increasing fluid with water/sugar free electrolytes - Acetaminophen and ibuprofen as needed for fever/pain - Salt water gargling, chloraseptic spray and throat lozenges - OTC guaifenesin (Mucinex) 600 mg twice daily - Saline sinus flushes or a neti pot - Humidifying the air -Tessalon Perles during the day as needed for dry cough 

## 2022-03-14 NOTE — ED Triage Notes (Signed)
Pt reports sore throat, eye drainage, nasal congestion, bilateral ear pain and on and off headache x 2-3 days. Pt has not taken any meds for complaints.

## 2022-03-14 NOTE — ED Provider Notes (Signed)
[facility]    CSN: [id number] Arrival date & time: 03/14/22  1429      History   Chief Complaint Chief Complaint  Patient presents with   Sore Throat    Headache - Entered by patient'   Appointment    1500    HPI Margaret Gonzalez is a 61 y.o. female.   Patient presents for a few days of feeling poorly.  She endorses dry cough, chest pain with coughing, nasal congestion, runny nose, postnasal drainage and sneezing, sore throat, sinus pressure around her eyes, headache, thick white drainage from her eyes, nausea without vomiting, diarrhea, decreased appetite, fatigue, and dizziness.  Denies fever, shortness of breath, chest congestion, abdominal pain, vomiting, loss of taste or smell.  Reports she has been around numerous coworkers who have had similar symptoms.  She has taken throat lozenges for the sore throat with minimal relief.  Patient has not tested for COVID-19 since her symptoms started.  Reports she has had COVID-19 "years" ago.  Patient has not been vaccinated for COVID-19.    Past Medical History:  Diagnosis Date   Arthritis    Depression    Headache(784.0)    Hypertension    Pre-diabetes     Patient Active Problem List   Diagnosis Date Noted   Adjustment disorder with mixed emotional features 07/20/2020   Anxiety 07/20/2020   Body mass index (BMI) 39.0-39.9, adult 07/20/2020   Fatty liver 07/20/2020   Hyperlipidemia 07/20/2020   Hypertension 07/20/2020   Iron deficiency anemia 07/20/2020   Recurrent major depression (HCC) 07/20/2020   Type 2 diabetes mellitus with other specified complication (HCC) 07/20/2020   Closed displaced transverse fracture of shaft of left humerus 06/27/2020    Past Surgical History:  Procedure Laterality Date   CESAREAN SECTION  23,55,73   x3   CHOLECYSTECTOMY  1986   CLOSED REDUCTION HUMERUS FRACTURE Left 07/01/2020   Procedure: CLOSED REDUCTION HUMERAL SHAFT;  Surgeon: Vickki Hearing, MD;  Location: [facility];   Service: Orthopedics;  Laterality: Left;   COLONOSCOPY     KNEE ARTHROSCOPY Left 11/12/2012   Procedure: ARTHROSCOPY KNEE ;  Surgeon: Harvie Junior, MD;  Location: [facility];  Service: Orthopedics;  Laterality: Left;  partial medial and lateral menisectomies and chrondroplasty of patella   KNEE ARTHROSCOPY Right    ORIF HUMERUS FRACTURE Left 07/26/2020   Procedure: OPEN REDUCTION INTERNAL FIXATION (ORIF) HUMERAL SHAFT FRACTURE WITH AUTOGRAFT;  Surgeon: Vickki Hearing, MD;  Location: [facility];  Service: Orthopedics;  Laterality: Left;    OB History   No obstetric history on file.      Home Medications    Prior to Admission medications   Medication Sig Start Date End Date Taking? Authorizing Provider  benzonatate (TESSALON) 100 MG capsule Take 1 capsule (100 mg total) by mouth 3 (three) times daily as needed for cough. Do not take with alcohol or while driving or operating heavy machinery.  May cause drowsiness. 03/14/22  Yes Cathlean Marseilles A, NP  amLODipine (NORVASC) 10 MG tablet Take 10 mg by mouth daily. 05/27/20   [provider]  amLODipine (NORVASC) 10 MG tablet 1 tablet by mouth Once a day 90 days 10/22/21     amLODipine (NORVASC) 10 MG tablet 1 tablet by mouth Once a day 90 days 03/06/22     aspirin EC 81 MG tablet Take 81 mg by mouth daily. Swallow whole.    [provider]  B Complex Vitamins (  VITAMIN B COMPLEX) TABS Take 1 tablet by mouth daily.    [provider]  buPROPion (WELLBUTRIN XL) 150 MG 24 hr tablet Take 150 mg by mouth daily. 03/28/20   [provider]  buPROPion (WELLBUTRIN XL) 150 MG 24 hr tablet 1 tablet by mouth every morning 90 days 10/22/21     buPROPion (WELLBUTRIN XL) 150 MG 24 hr tablet Take 1 tablet by mouth every morning 90 days 03/06/22     citalopram (CELEXA) 20 MG tablet Take 20 mg by mouth daily.     [provider]  citalopram (CELEXA) 20 MG tablet 1 tablet by mouth Once a day 90 days 10/22/21      citalopram (CELEXA) 20 MG tablet Take 1 tablet by mouth Once a day 90 days 03/06/22     clonazePAM (KLONOPIN) 1 MG tablet Take 0.5 tablets (0.5 mg total) by mouth daily as needed. 10/23/21     diphenhydrAMINE (BENADRYL) 25 mg capsule Take 1 capsule (25 mg total) by mouth every 4 (four) hours as needed. 06/22/20   Vickki HearingHarrison, Stanley E, MD  fish oil-omega-3 fatty acids 1000 MG capsule Take 2 g by mouth daily.    [provider]  HYDROcodone-acetaminophen (NORCO) 7.5-325 MG tablet Take 1 tablet by mouth every 6 (six) hours as needed for moderate pain. 09/15/20   Vickki HearingHarrison, Stanley E, MD  losartan-hydrochlorothiazide (HYZAAR) 100-25 MG tablet Take 1 tablet by mouth daily.    [provider]  losartan-hydrochlorothiazide (HYZAAR) 100-25 MG tablet 1 tablet by mouth Once a day 90 days 10/22/21     losartan-hydrochlorothiazide (HYZAAR) 100-25 MG tablet 1 tablet by mouth Once a day 90 days 03/06/22     methocarbamol (ROBAXIN) 500 MG tablet Take 1 tablet (500 mg total) by mouth 2 (two) times daily. Patient not taking: Reported on 10/20/2020 06/21/20   Gailen ShelterFondaw, Wylder S, PA  Multiple Vitamins-Minerals (VITAMIN D3 COMPLETE) TABS Take 1 tablet by mouth daily.    [provider]  omeprazole (PRILOSEC) 20 MG capsule Take 20 mg by mouth daily.    [provider]  ondansetron (ZOFRAN ODT) 4 MG disintegrating tablet Take 1 tablet (4 mg total) by mouth every 8 (eight) hours as needed for nausea or vomiting. Patient not taking: Reported on 10/20/2020 06/21/20   Gailen ShelterFondaw, Wylder S, PA  Red Yeast Rice 600 MG CAPS Take 1,200 mg by mouth daily.    [provider]  Semaglutide,0.25 or 0.5MG /DOS, (OZEMPIC, 0.25 OR 0.5 MG/DOSE,) 2 MG/3ML SOPN Inject 0.5 mg Subcutaneous Once a week 30 days 02/15/22       Family History History reviewed. No pertinent family history.  Social History Social History   Tobacco Use   Smoking status: Never   Smokeless tobacco: Never  Vaping Use   Vaping  Use: Never used  Substance Use Topics   Alcohol use: Yes    Comment: rare   Drug use: No     Allergies   Codeine and Oxycodone   Review of Systems Review of Systems Per HPI  Physical Exam Triage Vital Signs ED Triage Vitals  Enc Vitals Group     BP 03/14/22 1501 (!) 145/90     Pulse Rate 03/14/22 1501 85     Resp 03/14/22 1501 18     Temp 03/14/22 1501 98.2 F (36.8 C)     Temp Source 03/14/22 1501 Oral     SpO2 03/14/22 1501 97 %     Weight --      Height --  Head Circumference --      Peak Flow --      Pain Score 03/14/22 1459 4     Pain Loc --      Pain Edu? --      Excl. in GC? --    No data found.  Updated Vital Signs BP (!) 145/90 (BP Location: Right Arm)   Pulse 85   Temp 98.2 F (36.8 C) (Oral)   Resp 18   SpO2 97%   Visual Acuity Right Eye Distance:   Left Eye Distance:   Bilateral Distance:    Right Eye Near:   Left Eye Near:    Bilateral Near:     Physical Exam Vitals and nursing note reviewed.  Constitutional:      General: She is not in acute distress.    Appearance: Normal appearance. She is not ill-appearing or toxic-appearing.  HENT:     Head: Normocephalic and atraumatic.     Right Ear: Tympanic membrane, ear canal and external ear normal.     Left Ear: Tympanic membrane, ear canal and external ear normal.     Nose: Congestion present. No rhinorrhea.     Mouth/Throat:     Mouth: Mucous membranes are moist.     Pharynx: Oropharynx is clear. No oropharyngeal exudate or posterior oropharyngeal erythema.  Eyes:     General: No scleral icterus.    Extraocular Movements: Extraocular movements intact.  Cardiovascular:     Rate and Rhythm: Normal rate and regular rhythm.  Pulmonary:     Effort: Pulmonary effort is normal. No respiratory distress.     Breath sounds: Normal breath sounds. No wheezing, rhonchi or rales.  Abdominal:     General: Abdomen is flat. Bowel sounds are normal. There is no distension.     Palpations:  Abdomen is soft.     Tenderness: There is no abdominal tenderness. There is no guarding.  Musculoskeletal:     Cervical back: Normal range of motion and neck supple.  Lymphadenopathy:     Cervical: No cervical adenopathy.  Skin:    General: Skin is warm and dry.     Coloration: Skin is not jaundiced or pale.     Findings: No erythema or rash.  Neurological:     Mental Status: She is alert and oriented to person, place, and time.  Psychiatric:        Behavior: Behavior is cooperative.      UC Treatments / Results  Labs (all labs ordered are listed, but only abnormal results are displayed) Labs Reviewed  SARS CORONAVIRUS 2 (TAT 6-24 HRS)    EKG   Radiology No results found.  Procedures Procedures (including critical care time)  Medications Ordered in UC Medications - No data to display  Initial Impression / Assessment and Plan / UC Course  I have reviewed the triage vital signs and the nursing notes.  Pertinent labs & imaging results that were available during my care of the patient were reviewed by me and considered in my medical decision making (see chart for details).   Patient is well-appearing, normotensive, afebrile, not tachycardic, not tachypneic, oxygenating well on room air.    Encounter for screening for COVID-19 Viral URI with cough Suspect viral etiology COVID-19 testing obtained Supportive care discussed Patient declines antiviral therapy if she test positive Start cough suppressant ER and return precautions discussed Note given for work  The patient was given the opportunity to ask questions.  All questions answered to their satisfaction.  The patient is in agreement to this plan.    Final Clinical Impressions(s) / UC Diagnoses   Final diagnoses:  Encounter for screening for COVID-19  Viral URI with cough     Discharge Instructions      You have a viral upper respiratory infection.  Symptoms should improve over the next week to 10 days.   If you develop chest pain or shortness of breath, go to the emergency room.  We have tested you today for COVID-19.  You will see the results in Mychart and we will call you with positive results.    Please stay home and isolate until you are aware of the results.    Some things that can make you feel better are: - Increased rest - Increasing fluid with water/sugar free electrolytes - Acetaminophen and ibuprofen as needed for fever/pain - Salt water gargling, chloraseptic spray and throat lozenges - OTC guaifenesin (Mucinex) 600 mg twice daily - Saline sinus flushes or a neti pot - Humidifying the air -Tessalon Perles during the day as needed for dry cough     ED Prescriptions     Medication Sig Dispense Auth. Provider   benzonatate (TESSALON) 100 MG capsule Take 1 capsule (100 mg total) by mouth 3 (three) times daily as needed for cough. Do not take with alcohol or while driving or operating heavy machinery.  May cause drowsiness. 21 capsule Valentino Nose, NP      PDMP not reviewed this encounter.   Valentino Nose, NP 03/14/22 [phone]

## 2022-03-15 LAB — SARS CORONAVIRUS 2 (TAT 6-24 HRS): SARS Coronavirus 2: NEGATIVE

## 2022-03-16 ENCOUNTER — Telehealth: Payer: No Typology Code available for payment source | Admitting: Physician Assistant

## 2022-03-16 ENCOUNTER — Other Ambulatory Visit: Payer: Self-pay

## 2022-03-16 DIAGNOSIS — B9689 Other specified bacterial agents as the cause of diseases classified elsewhere: Secondary | ICD-10-CM | POA: Diagnosis not present

## 2022-03-16 DIAGNOSIS — J019 Acute sinusitis, unspecified: Secondary | ICD-10-CM | POA: Diagnosis not present

## 2022-03-16 MED ORDER — AMOXICILLIN-POT CLAVULANATE 875-125 MG PO TABS: 1.0000 | ORAL_TABLET | Freq: Two times a day (BID) | ORAL | 0 refills | Status: DC

## 2022-03-16 MED ORDER — PROMETHAZINE-DM 6.25-15 MG/5ML PO SYRP: 5.0000 mL | ORAL_SOLUTION | Freq: Four times a day (QID) | ORAL | 0 refills | Status: AC | PRN

## 2022-03-16 NOTE — Progress Notes (Signed)
Virtual Visit Consent   Margaret Gonzalez, you are scheduled for a virtual visit with a [facility] provider today. Just as with appointments in the office, your consent must be obtained to participate. Your consent will be active for this visit and any virtual visit you may have with one of our providers in the next 365 days. If you have a MyChart account, a copy of this consent can be sent to you electronically.  As this is a virtual visit, video technology does not allow for your provider to perform a traditional examination. This may limit your provider's ability to fully assess your condition. If your provider identifies any concerns that need to be evaluated in person or the need to arrange testing (such as labs, EKG, etc.), we will make arrangements to do so. Although advances in technology are sophisticated, we cannot ensure that it will always work on either your end or our end. If the connection with a video visit is poor, the visit may have to be switched to a telephone visit. With either a video or telephone visit, we are not always able to ensure that we have a secure connection.  By engaging in this virtual visit, you consent to the provision of healthcare and authorize for your insurance to be billed (if applicable) for the services provided during this visit. Depending on your insurance coverage, you may receive a charge related to this service.  I need to obtain your verbal consent now. Are you willing to proceed with your visit today? Margaret Gonzalez has provided verbal consent on 03/16/2022 for a virtual visit (video or telephone). Margaret Loveless, PA-C  Date: 03/16/2022 5:09 PM  Virtual Visit via Video Note   I, Margaret Gonzalez, connected with  Margaret Gonzalez  ([id number], June 30, 1960) on 03/16/22 at  5:15 PM EST by a video-enabled telemedicine application and verified that I am speaking with the correct person using two identifiers.  Location: Patient: Virtual Visit Location  Patient: Home Provider: Virtual Visit Location Provider: Home Office   I discussed the limitations of evaluation and management by telemedicine and the availability of in person appointments. The patient expressed understanding and agreed to proceed.    History of Present Illness: Margaret Gonzalez is a 61 y.o. who identifies as a female who was assigned female at birth, and is being seen today for URI symptoms.  HPI: URI  This is a new problem. The current episode started in the past 7 days. The problem has been gradually worsening. There has been no fever. Associated symptoms include congestion, coughing, headaches, a plugged ear sensation, rhinorrhea (post nasal drainage), sinus pain and a sore throat (initially but now improved). Pertinent negatives include no chest pain, diarrhea, ear pain, nausea, vomiting or wheezing. Associated symptoms comments: Facial pain. She has tried increased fluids (mucinex, cough drops) for the symptoms. The treatment provided no relief.   Was seen in UC on 03/14/22 and underwent Covid 19 testing that was negative. Diagnosed with Viral URI and given Rx for Mucinex and tessalon perles.   Problems:  Patient Active Problem List   Diagnosis Date Noted   Adjustment disorder with mixed emotional features 07/20/2020   Anxiety 07/20/2020   Body mass index (BMI) 39.0-39.9, adult 07/20/2020   Fatty liver 07/20/2020   Hyperlipidemia 07/20/2020   Hypertension 07/20/2020   Iron deficiency anemia 07/20/2020   Recurrent major depression (HCC) 07/20/2020   Type 2 diabetes mellitus with other specified complication (HCC) 07/20/2020  Closed displaced transverse fracture of shaft of left humerus 06/27/2020    Allergies:  Allergies  Allergen Reactions   Codeine Itching   Oxycodone Itching   Medications:  Current Outpatient Medications:    amoxicillin-clavulanate (AUGMENTIN) 875-125 MG tablet, Take 1 tablet by mouth 2 (two) times daily., Disp: 20 tablet, Rfl: 0    promethazine-dextromethorphan (PROMETHAZINE-DM) 6.25-15 MG/5ML syrup, Take 5 mLs by mouth 4 (four) times daily as needed., Disp: 118 mL, Rfl: 0   amLODipine (NORVASC) 10 MG tablet, Take 10 mg by mouth daily., Disp: , Rfl:    amLODipine (NORVASC) 10 MG tablet, 1 tablet by mouth Once a day 90 days, Disp: 90 tablet, Rfl: 0   amLODipine (NORVASC) 10 MG tablet, 1 tablet by mouth Once a day 90 days, Disp: 90 tablet, Rfl: 0   aspirin EC 81 MG tablet, Take 81 mg by mouth daily. Swallow whole., Disp: , Rfl:    B Complex Vitamins (VITAMIN B COMPLEX) TABS, Take 1 tablet by mouth daily., Disp: , Rfl:    benzonatate (TESSALON) 100 MG capsule, Take 1 capsule (100 mg total) by mouth 3 (three) times daily as needed for cough. Do not take with alcohol or while driving or operating heavy machinery.  May cause drowsiness., Disp: 21 capsule, Rfl: 0   buPROPion (WELLBUTRIN XL) 150 MG 24 hr tablet, Take 150 mg by mouth daily., Disp: , Rfl:    buPROPion (WELLBUTRIN XL) 150 MG 24 hr tablet, 1 tablet by mouth every morning 90 days, Disp: 90 tablet, Rfl: 0   buPROPion (WELLBUTRIN XL) 150 MG 24 hr tablet, Take 1 tablet by mouth every morning 90 days, Disp: 90 tablet, Rfl: 0   citalopram (CELEXA) 20 MG tablet, Take 20 mg by mouth daily. , Disp: , Rfl:    citalopram (CELEXA) 20 MG tablet, 1 tablet by mouth Once a day 90 days, Disp: 90 tablet, Rfl: 0   citalopram (CELEXA) 20 MG tablet, Take 1 tablet by mouth Once a day 90 days, Disp: 90 tablet, Rfl: 0   clonazePAM (KLONOPIN) 1 MG tablet, Take 0.5 tablets (0.5 mg total) by mouth daily as needed., Disp: 45 tablet, Rfl: 0   diphenhydrAMINE (BENADRYL) 25 mg capsule, Take 1 capsule (25 mg total) by mouth every 4 (four) hours as needed., Disp: 60 capsule, Rfl: 2   fish oil-omega-3 fatty acids 1000 MG capsule, Take 2 g by mouth daily., Disp: , Rfl:    HYDROcodone-acetaminophen (NORCO) 7.5-325 MG tablet, Take 1 tablet by mouth every 6 (six) hours as needed for moderate pain., Disp: 28  tablet, Rfl: 0   losartan-hydrochlorothiazide (HYZAAR) 100-25 MG tablet, Take 1 tablet by mouth daily., Disp: , Rfl:    losartan-hydrochlorothiazide (HYZAAR) 100-25 MG tablet, 1 tablet by mouth Once a day 90 days, Disp: 90 tablet, Rfl: 0   losartan-hydrochlorothiazide (HYZAAR) 100-25 MG tablet, 1 tablet by mouth Once a day 90 days, Disp: 90 tablet, Rfl: 0   methocarbamol (ROBAXIN) 500 MG tablet, Take 1 tablet (500 mg total) by mouth 2 (two) times daily. (Patient not taking: Reported on 10/20/2020), Disp: 20 tablet, Rfl: 0   Multiple Vitamins-Minerals (VITAMIN D3 COMPLETE) TABS, Take 1 tablet by mouth daily., Disp: , Rfl:    omeprazole (PRILOSEC) 20 MG capsule, Take 20 mg by mouth daily., Disp: , Rfl:    ondansetron (ZOFRAN ODT) 4 MG disintegrating tablet, Take 1 tablet (4 mg total) by mouth every 8 (eight) hours as needed for nausea or vomiting. (Patient not taking: Reported on  10/20/2020), Disp: 20 tablet, Rfl: 0   Red Yeast Rice 600 MG CAPS, Take 1,200 mg by mouth daily., Disp: , Rfl:    Semaglutide,0.25 or 0.5MG /DOS, (OZEMPIC, 0.25 OR 0.5 MG/DOSE,) 2 MG/3ML SOPN, Inject 0.5 mg Subcutaneous Once a week 30 days, Disp: 6 mL, Rfl: 0  Observations/Objective: Patient is well-developed, well-nourished in no acute distress.  Resting comfortably at home.  Head is normocephalic, atraumatic.  No labored breathing.  Speech is clear and coherent with logical content.  Patient is alert and oriented at baseline.    Assessment and Plan: 1. Acute bacterial sinusitis - amoxicillin-clavulanate (AUGMENTIN) 875-125 MG tablet; Take 1 tablet by mouth 2 (two) times daily.  Dispense: 20 tablet; Refill: 0 - promethazine-dextromethorphan (PROMETHAZINE-DM) 6.25-15 MG/5ML syrup; Take 5 mLs by mouth 4 (four) times daily as needed.  Dispense: 118 mL; Refill: 0  - Worsening symptoms that have not responded to OTC medications.  - Will give Augmentin and Promethazine DM - Continue allergy medications.  - Steam and  humidifier can help - Stay well hydrated and get plenty of rest.  - Seek in person evaluation if no symptom improvement or if symptoms worsen   Follow Up Instructions: I discussed the assessment and treatment plan with the patient. The patient was provided an opportunity to ask questions and all were answered. The patient agreed with the plan and demonstrated an understanding of the instructions.  A copy of instructions were sent to the patient via MyChart unless otherwise noted below.    The patient was advised to call back or seek an in-person evaluation if the symptoms worsen or if the condition fails to improve as anticipated.  Time:  I spent 10 minutes with the patient via telehealth technology discussing the above problems/concerns.    Margaret Loveless, PA-C

## 2022-03-16 NOTE — Patient Instructions (Signed)
Margaret Gonzalez, thank you for joining [name], PA-C for today's virtual visit.  While this provider is not your primary care provider (PCP), if your PCP is located in our provider database this encounter information will be shared with them immediately following your visit.   A [facility] MyChart account gives you access to today's visit and all your visits, tests, and labs performed at [facility] " click here if you don't have a [facility] MyChart account or go to [email or web address]  Consent: (Patient) Margaret Gonzalez provided verbal consent for this virtual visit at the beginning of the encounter.  Current Medications:  Current Outpatient Medications:    amoxicillin-clavulanate (AUGMENTIN) 875-125 MG tablet, Take 1 tablet by mouth 2 (two) times daily., Disp: 20 tablet, Rfl: 0   promethazine-dextromethorphan (PROMETHAZINE-DM) 6.25-15 MG/5ML syrup, Take 5 mLs by mouth 4 (four) times daily as needed., Disp: 118 mL, Rfl: 0   amLODipine (NORVASC) 10 MG tablet, Take 10 mg by mouth daily., Disp: , Rfl:    amLODipine (NORVASC) 10 MG tablet, 1 tablet by mouth Once a day 90 days, Disp: 90 tablet, Rfl: 0   amLODipine (NORVASC) 10 MG tablet, 1 tablet by mouth Once a day 90 days, Disp: 90 tablet, Rfl: 0   aspirin EC 81 MG tablet, Take 81 mg by mouth daily. Swallow whole., Disp: , Rfl:    B Complex Vitamins (VITAMIN B COMPLEX) TABS, Take 1 tablet by mouth daily., Disp: , Rfl:    benzonatate (TESSALON) 100 MG capsule, Take 1 capsule (100 mg total) by mouth 3 (three) times daily as needed for cough. Do not take with alcohol or while driving or operating heavy machinery.  May cause drowsiness., Disp: 21 capsule, Rfl: 0   buPROPion (WELLBUTRIN XL) 150 MG 24 hr tablet, Take 150 mg by mouth daily., Disp: , Rfl:    buPROPion (WELLBUTRIN XL) 150 MG 24 hr tablet, 1 tablet by mouth every morning 90 days, Disp: 90 tablet, Rfl: 0   buPROPion (WELLBUTRIN XL) 150 MG 24 hr tablet, Take 1  tablet by mouth every morning 90 days, Disp: 90 tablet, Rfl: 0   citalopram (CELEXA) 20 MG tablet, Take 20 mg by mouth daily. , Disp: , Rfl:    citalopram (CELEXA) 20 MG tablet, 1 tablet by mouth Once a day 90 days, Disp: 90 tablet, Rfl: 0   citalopram (CELEXA) 20 MG tablet, Take 1 tablet by mouth Once a day 90 days, Disp: 90 tablet, Rfl: 0   clonazePAM (KLONOPIN) 1 MG tablet, Take 0.5 tablets (0.5 mg total) by mouth daily as needed., Disp: 45 tablet, Rfl: 0   diphenhydrAMINE (BENADRYL) 25 mg capsule, Take 1 capsule (25 mg total) by mouth every 4 (four) hours as needed., Disp: 60 capsule, Rfl: 2   fish oil-omega-3 fatty acids 1000 MG capsule, Take 2 g by mouth daily., Disp: , Rfl:    HYDROcodone-acetaminophen (NORCO) 7.5-325 MG tablet, Take 1 tablet by mouth every 6 (six) hours as needed for moderate pain., Disp: 28 tablet, Rfl: 0   losartan-hydrochlorothiazide (HYZAAR) 100-25 MG tablet, Take 1 tablet by mouth daily., Disp: , Rfl:    losartan-hydrochlorothiazide (HYZAAR) 100-25 MG tablet, 1 tablet by mouth Once a day 90 days, Disp: 90 tablet, Rfl: 0   losartan-hydrochlorothiazide (HYZAAR) 100-25 MG tablet, 1 tablet by mouth Once a day 90 days, Disp: 90 tablet, Rfl: 0   methocarbamol (ROBAXIN) 500 MG tablet, Take 1 tablet (500 mg total) by mouth 2 (two) times daily. (  Patient not taking: Reported on 10/20/2020), Disp: 20 tablet, Rfl: 0   Multiple Vitamins-Minerals (VITAMIN D3 COMPLETE) TABS, Take 1 tablet by mouth daily., Disp: , Rfl:    omeprazole (PRILOSEC) 20 MG capsule, Take 20 mg by mouth daily., Disp: , Rfl:    ondansetron (ZOFRAN ODT) 4 MG disintegrating tablet, Take 1 tablet (4 mg total) by mouth every 8 (eight) hours as needed for nausea or vomiting. (Patient not taking: Reported on 10/20/2020), Disp: 20 tablet, Rfl: 0   Red Yeast Rice 600 MG CAPS, Take 1,200 mg by mouth daily., Disp: , Rfl:    Semaglutide,0.25 or 0.5MG /DOS, (OZEMPIC, 0.25 OR 0.5 MG/DOSE,) 2 MG/3ML SOPN, Inject 0.5 mg  Subcutaneous Once a week 30 days, Disp: 6 mL, Rfl: 0   Medications ordered in this encounter:  Meds ordered this encounter  Medications   amoxicillin-clavulanate (AUGMENTIN) 875-125 MG tablet    Sig: Take 1 tablet by mouth 2 (two) times daily.    Dispense:  20 tablet    Refill:  0    Order Specific Question:   Supervising Provider    Answer:   Merrilee Jansky [id number]   promethazine-dextromethorphan (PROMETHAZINE-DM) 6.25-15 MG/5ML syrup    Sig: Take 5 mLs by mouth 4 (four) times daily as needed.    Dispense:  118 mL    Refill:  0    Order Specific Question:   Supervising Provider    Answer:   Merrilee Jansky [[id number]]     *If you need refills on other medications prior to your next appointment, please contact your pharmacy*  Follow-Up: Call back or seek an in-person evaluation if the symptoms worsen or if the condition fails to improve as anticipated.  [facility] [phone]  Other Instructions Sinus Infection, Adult A sinus infection, also called sinusitis, is inflammation of your sinuses. Sinuses are hollow spaces in the bones around your face. Your sinuses are located: Around your eyes. In the middle of your forehead. Behind your nose. In your cheekbones. Mucus normally drains out of your sinuses. When your nasal tissues become inflamed or swollen, mucus can become trapped or blocked. This allows bacteria, viruses, and fungi to grow, which leads to infection. Most infections of the sinuses are caused by a virus. A sinus infection can develop quickly. It can last for up to 4 weeks (acute) or for more than 12 weeks (chronic). A sinus infection often develops after a cold. What are the causes? This condition is caused by anything that creates swelling in the sinuses or stops mucus from draining. This includes: Allergies. Asthma. Infection from bacteria or viruses. Deformities or blockages in your nose or sinuses. Abnormal growths in the nose (nasal  polyps). Pollutants, such as chemicals or irritants in the air. Infection from fungi. This is rare. What increases the risk? You are more likely to develop this condition if you: Have a weak body defense system (immune system). Do a lot of swimming or diving. Overuse nasal sprays. Smoke. What are the signs or symptoms? The main symptoms of this condition are pain and a feeling of pressure around the affected sinuses. Other symptoms include: Stuffy nose or congestion that makes it difficult to breathe through your nose. Thick yellow or greenish drainage from your nose. Tenderness, swelling, and warmth over the affected sinuses. A cough that may get worse at night. Decreased sense of smell and taste. Extra mucus that collects in the throat or the back of the nose (postnasal drip) causing a  sore throat or bad breath. Tiredness (fatigue). Fever. How is this diagnosed? This condition is diagnosed based on: Your symptoms. Your medical history. A physical exam. Tests to find out if your condition is acute or chronic. This may include: Checking your nose for nasal polyps. Viewing your sinuses using a device that has a light (endoscope). Testing for allergies or bacteria. Imaging tests, such as an MRI or CT scan. In rare cases, a bone biopsy may be done to rule out more serious types of fungal sinus disease. How is this treated? Treatment for a sinus infection depends on the cause and whether your condition is chronic or acute. If caused by a virus, your symptoms should go away on their own within 10 days. You may be given medicines to relieve symptoms. They include: Medicines that shrink swollen nasal passages (decongestants). A spray that eases inflammation of the nostrils (topical intranasal corticosteroids). Rinses that help get rid of thick mucus in your nose (nasal saline washes). Medicines that treat allergies (antihistamines). Over-the-counter pain relievers. If caused by  bacteria, your health care provider may recommend waiting to see if your symptoms improve. Most bacterial infections will get better without antibiotic medicine. You may be given antibiotics if you have: A severe infection. A weak immune system. If caused by narrow nasal passages or nasal polyps, surgery may be needed. Follow these instructions at home: Medicines Take, use, or apply over-the-counter and prescription medicines only as told by your health care provider. These may include nasal sprays. If you were prescribed an antibiotic medicine, take it as told by your health care provider. Do not stop taking the antibiotic even if you start to feel better. Hydrate and humidify  Drink enough fluid to keep your urine pale yellow. Staying hydrated will help to thin your mucus. Use a cool mist humidifier to keep the humidity level in your home above 50%. Inhale steam for 10-15 minutes, 3-4 times a day, or as told by your health care provider. You can do this in the bathroom while a hot shower is running. Limit your exposure to cool or dry air. Rest Rest as much as possible. Sleep with your head raised (elevated). Make sure you get enough sleep each night. General instructions  Apply a warm, moist washcloth to your face 3-4 times a day or as told by your health care provider. This will help with discomfort. Use nasal saline washes as often as told by your health care provider. Wash your hands often with soap and water to reduce your exposure to germs. If soap and water are not available, use hand sanitizer. Do not smoke. Avoid being around people who are smoking (secondhand smoke). Keep all follow-up visits. This is important. Contact a health care provider if: You have a fever. Your symptoms get worse. Your symptoms do not improve within 10 days. Get help right away if: You have a severe headache. You have persistent vomiting. You have severe pain or swelling around your face or  eyes. You have vision problems. You develop confusion. Your neck is stiff. You have trouble breathing. These symptoms may be an emergency. Get help right away. Call 911. Do not wait to see if the symptoms will go away. Do not drive yourself to the hospital. Summary A sinus infection is soreness and inflammation of your sinuses. Sinuses are hollow spaces in the bones around your face. This condition is caused by nasal tissues that become inflamed or swollen. The swelling traps or blocks the flow of  mucus. This allows bacteria, viruses, and fungi to grow, which leads to infection. If you were prescribed an antibiotic medicine, take it as told by your health care provider. Do not stop taking the antibiotic even if you start to feel better. Keep all follow-up visits. This is important. This information is not intended to replace advice given to you by your health care provider. Make sure you discuss any questions you have with your health care provider. Document Revised: 03/21/2021 Document Reviewed: 03/21/2021 Elsevier Patient Education  2023 Elsevier Inc.    If you have been instructed to have an in-person evaluation today at a local Urgent Care facility, please use the link below. It will take you to a list of all of our available [facility], including address, phone number and hours of operation. Please do not delay care.  [facility]  If you or a family member do not have a primary care provider, use the link below to schedule a visit and establish care. When you choose a [facility] primary care physician or advanced practice provider, you gain a long-term partner in health. Find a Primary Care Provider  Learn more about [facility]'s in-office and virtual care options: [facility] - Get Care Now

## 2022-03-20 ENCOUNTER — Other Ambulatory Visit: Payer: Self-pay

## 2022-04-11 ENCOUNTER — Other Ambulatory Visit: Payer: Self-pay

## 2022-04-11 MED ORDER — HYDROCODONE-ACETAMINOPHEN 5-325 MG PO TABS
ORAL_TABLET | ORAL | 0 refills | Status: AC
  Filled 2022-04-11: qty 30, 4d supply, fill #0

## 2022-04-18 ENCOUNTER — Other Ambulatory Visit: Payer: Self-pay

## 2022-04-18 MED ORDER — OZEMPIC (0.25 OR 0.5 MG/DOSE) 2 MG/3ML ~~LOC~~ SOPN
0.5000 mg | PEN_INJECTOR | SUBCUTANEOUS | 0 refills | Status: AC
  Filled 2022-04-18: qty 6, 60d supply, fill #0
  Filled 2022-04-27: qty 3, 28d supply, fill #0

## 2022-04-24 ENCOUNTER — Other Ambulatory Visit: Payer: Self-pay

## 2022-04-27 ENCOUNTER — Other Ambulatory Visit: Payer: Self-pay

## 2022-05-01 DIAGNOSIS — R531 Weakness: Secondary | ICD-10-CM | POA: Diagnosis not present

## 2022-05-01 DIAGNOSIS — M25662 Stiffness of left knee, not elsewhere classified: Secondary | ICD-10-CM | POA: Diagnosis not present

## 2022-05-15 DIAGNOSIS — M25662 Stiffness of left knee, not elsewhere classified: Secondary | ICD-10-CM | POA: Diagnosis not present

## 2022-05-15 DIAGNOSIS — R531 Weakness: Secondary | ICD-10-CM | POA: Diagnosis not present

## 2022-05-31 ENCOUNTER — Other Ambulatory Visit (HOSPITAL_COMMUNITY): Payer: Self-pay | Admitting: Orthopedic Surgery

## 2022-05-31 ENCOUNTER — Ambulatory Visit (HOSPITAL_COMMUNITY)
Admission: RE | Admit: 2022-05-31 | Discharge: 2022-05-31 | Disposition: A | Discharge status: Home / Self Care | Payer: 59 | Source: Ambulatory Visit | Attending: Orthopedic Surgery | Admitting: Orthopedic Surgery

## 2022-05-31 ENCOUNTER — Other Ambulatory Visit: Payer: Self-pay

## 2022-05-31 DIAGNOSIS — M79605 Pain in left leg: Secondary | ICD-10-CM

## 2022-05-31 DIAGNOSIS — M7989 Other specified soft tissue disorders: Secondary | ICD-10-CM | POA: Insufficient documentation

## 2022-05-31 MED ORDER — MELOXICAM 15 MG PO TABS
15.0000 mg | ORAL_TABLET | Freq: Every day | ORAL | 0 refills | Status: DC
  Filled 2022-05-31: qty 30, 30d supply, fill #0

## 2022-05-31 NOTE — Progress Notes (Signed)
Lower extremity venous duplex has been completed.   Preliminary results in CV Proc.   Margaret Gonzalez 05/31/2022 11:55 AM

## 2022-06-12 DIAGNOSIS — M1712 Unilateral primary osteoarthritis, left knee: Secondary | ICD-10-CM | POA: Diagnosis not present

## 2022-06-19 DIAGNOSIS — I10 Essential (primary) hypertension: Secondary | ICD-10-CM | POA: Diagnosis not present

## 2022-06-19 DIAGNOSIS — E1169 Type 2 diabetes mellitus with other specified complication: Secondary | ICD-10-CM | POA: Diagnosis not present

## 2022-06-19 DIAGNOSIS — F3341 Major depressive disorder, recurrent, in partial remission: Secondary | ICD-10-CM | POA: Diagnosis not present

## 2022-06-19 DIAGNOSIS — K760 Fatty (change of) liver, not elsewhere classified: Secondary | ICD-10-CM | POA: Diagnosis not present

## 2022-06-19 DIAGNOSIS — E785 Hyperlipidemia, unspecified: Secondary | ICD-10-CM | POA: Diagnosis not present

## 2022-06-19 DIAGNOSIS — Z1211 Encounter for screening for malignant neoplasm of colon: Secondary | ICD-10-CM | POA: Diagnosis not present

## 2022-06-25 ENCOUNTER — Other Ambulatory Visit: Payer: Self-pay

## 2022-06-25 MED ORDER — LOSARTAN POTASSIUM-HCTZ 100-25 MG PO TABS
1.0000 | ORAL_TABLET | Freq: Every day | ORAL | 1 refills | Status: AC
Start: 2022-06-25 — End: ?
  Filled 2022-06-25: qty 90, 90d supply, fill #0
  Filled 2022-10-08: qty 90, 90d supply, fill #1

## 2022-06-25 MED ORDER — CITALOPRAM HYDROBROMIDE 20 MG PO TABS
20.0000 mg | ORAL_TABLET | Freq: Every day | ORAL | 1 refills | Status: AC
  Filled 2022-06-25: qty 90, 90d supply, fill #0
  Filled 2022-10-08: qty 90, 90d supply, fill #1

## 2022-06-25 MED ORDER — AMLODIPINE BESYLATE 10 MG PO TABS
10.0000 mg | ORAL_TABLET | Freq: Every day | ORAL | 1 refills | Status: AC
  Filled 2022-06-25: qty 90, 90d supply, fill #0
  Filled 2022-10-08: qty 90, 90d supply, fill #1

## 2022-06-25 MED ORDER — BUPROPION HCL ER (XL) 150 MG PO TB24
150.0000 mg | ORAL_TABLET | Freq: Every morning | ORAL | 1 refills | Status: AC
Start: 2022-06-25 — End: ?
  Filled 2022-06-25: qty 90, 90d supply, fill #0
  Filled 2022-10-08: qty 90, 90d supply, fill #1

## 2022-06-27 ENCOUNTER — Other Ambulatory Visit: Payer: Self-pay

## 2022-06-27 MED ORDER — MELOXICAM 15 MG PO TABS
15.0000 mg | ORAL_TABLET | Freq: Every day | ORAL | 0 refills | Status: DC
  Filled 2022-06-27: qty 30, 30d supply, fill #0

## 2022-06-28 ENCOUNTER — Encounter: Payer: Self-pay | Admitting: Radiology

## 2022-06-29 ENCOUNTER — Other Ambulatory Visit: Payer: Self-pay

## 2022-08-13 DIAGNOSIS — M1711 Unilateral primary osteoarthritis, right knee: Secondary | ICD-10-CM | POA: Diagnosis not present

## 2022-08-15 ENCOUNTER — Other Ambulatory Visit: Payer: Self-pay | Admitting: Family Medicine

## 2022-08-15 ENCOUNTER — Ambulatory Visit
Admission: RE | Admit: 2022-08-15 | Discharge: 2022-08-15 | Disposition: A | Discharge status: Home / Self Care | Payer: 59 | Source: Ambulatory Visit | Attending: Family Medicine | Admitting: Family Medicine

## 2022-08-15 DIAGNOSIS — Z01818 Encounter for other preprocedural examination: Secondary | ICD-10-CM

## 2022-08-15 DIAGNOSIS — M25561 Pain in right knee: Secondary | ICD-10-CM | POA: Diagnosis not present

## 2022-08-15 DIAGNOSIS — M4184 Other forms of scoliosis, thoracic region: Secondary | ICD-10-CM | POA: Diagnosis not present

## 2022-08-17 ENCOUNTER — Other Ambulatory Visit: Payer: Self-pay

## 2022-08-17 MED ORDER — MELOXICAM 15 MG PO TABS
15.0000 mg | ORAL_TABLET | Freq: Every day | ORAL | 1 refills | Status: DC
  Filled 2022-08-17: qty 30, 30d supply, fill #0
  Filled 2022-11-13 – 2022-11-20 (×2): qty 30, 30d supply, fill #1

## 2022-09-03 DIAGNOSIS — M25661 Stiffness of right knee, not elsewhere classified: Secondary | ICD-10-CM | POA: Diagnosis not present

## 2022-09-03 DIAGNOSIS — M1731 Unilateral post-traumatic osteoarthritis, right knee: Secondary | ICD-10-CM | POA: Diagnosis not present

## 2022-09-03 DIAGNOSIS — R262 Difficulty in walking, not elsewhere classified: Secondary | ICD-10-CM | POA: Diagnosis not present

## 2022-09-11 DIAGNOSIS — M1711 Unilateral primary osteoarthritis, right knee: Secondary | ICD-10-CM | POA: Diagnosis not present

## 2022-09-12 ENCOUNTER — Other Ambulatory Visit: Payer: Self-pay

## 2022-09-13 ENCOUNTER — Other Ambulatory Visit: Payer: Self-pay

## 2022-09-13 MED ORDER — CELECOXIB 200 MG PO CAPS
200.0000 mg | ORAL_CAPSULE | Freq: Two times a day (BID) | ORAL | 0 refills | Status: AC
  Filled 2022-09-13: qty 60, 30d supply, fill #0

## 2022-09-13 MED ORDER — OXYCODONE HCL 5 MG PO TABS
5.0000 mg | ORAL_TABLET | ORAL | 0 refills | Status: AC | PRN
  Filled 2022-09-13: qty 40, 4d supply, fill #0

## 2022-09-13 MED ORDER — ASPIRIN 325 MG PO TBEC
325.0000 mg | DELAYED_RELEASE_TABLET | Freq: Two times a day (BID) | ORAL | 0 refills | Status: AC
Start: 2022-09-13 — End: ?

## 2022-09-13 MED ORDER — TIZANIDINE HCL 4 MG PO TABS
4.0000 mg | ORAL_TABLET | Freq: Three times a day (TID) | ORAL | 0 refills | Status: DC | PRN
  Filled 2022-09-13: qty 40, 14d supply, fill #0

## 2022-09-13 MED ORDER — DOCUSATE SODIUM 100 MG PO CAPS: 100.0000 mg | ORAL_CAPSULE | Freq: Two times a day (BID) | ORAL | 0 refills | Status: AC

## 2022-09-14 ENCOUNTER — Other Ambulatory Visit: Payer: Self-pay

## 2022-09-19 ENCOUNTER — Other Ambulatory Visit: Payer: Self-pay

## 2022-09-19 DIAGNOSIS — M1711 Unilateral primary osteoarthritis, right knee: Secondary | ICD-10-CM | POA: Diagnosis not present

## 2022-09-19 MED ORDER — HYDROMORPHONE HCL 2 MG PO TABS
2.0000 mg | ORAL_TABLET | Freq: Four times a day (QID) | ORAL | 0 refills | Status: AC | PRN
  Filled 2022-09-19: qty 30, 4d supply, fill #0

## 2022-09-19 MED ORDER — OXYCODONE HCL 5 MG PO TABS: ORAL_TABLET | ORAL | 0 refills | Status: DC

## 2022-09-20 NOTE — Therapy (Signed)
OUTPATIENT PHYSICAL THERAPY LOWER EXTREMITY EVALUATION   Patient Name: Margaret Gonzalez MRN: [id number] DOB:1960/12/05, 62 y.o., female Today's Date: 09/21/2022  END OF SESSION:  PT End of Session - 09/21/22 1213     Visit Number 1    Number of Visits 21    Date for PT Re-Evaluation 11/23/22    Authorization Type [place]    PT Start Time 1115    PT Stop Time 1155    PT Time Calculation (min) 40 min    Activity Tolerance Patient tolerated treatment well    Behavior During Therapy [facility] for tasks assessed/performed             Past Medical History:  Diagnosis Date   Arthritis    Depression    Headache(784.0)    Hypertension    Pre-diabetes    Past Surgical History:  Procedure Laterality Date   CESAREAN SECTION  [phone]   x3   CHOLECYSTECTOMY  1986   CLOSED REDUCTION HUMERUS FRACTURE Left 07/01/2020   Procedure: CLOSED REDUCTION HUMERAL SHAFT;  Surgeon: Vickki Hearing, MD;  Location: [facility];  Service: Orthopedics;  Laterality: Left;   COLONOSCOPY     KNEE ARTHROSCOPY Left 11/12/2012   Procedure: ARTHROSCOPY KNEE ;  Surgeon: Harvie Junior, MD;  Location: [facility];  Service: Orthopedics;  Laterality: Left;  partial medial and lateral menisectomies and chrondroplasty of patella   KNEE ARTHROSCOPY Right    ORIF HUMERUS FRACTURE Left 07/26/2020   Procedure: OPEN REDUCTION INTERNAL FIXATION (ORIF) HUMERAL SHAFT FRACTURE WITH AUTOGRAFT;  Surgeon: Vickki Hearing, MD;  Location: [facility];  Service: Orthopedics;  Laterality: Left;   Patient Active Problem List   Diagnosis Date Noted   Adjustment disorder with mixed emotional features 07/20/2020   Anxiety 07/20/2020   Body mass index (BMI) 39.0-39.9, adult 07/20/2020   Fatty liver 07/20/2020   Hyperlipidemia 07/20/2020   Hypertension 07/20/2020   Iron deficiency anemia 07/20/2020   Recurrent major depression (HCC) 07/20/2020   Type 2 diabetes mellitus with other specified complication (HCC) 07/20/2020    Closed displaced transverse fracture of shaft of left humerus 06/27/2020    PCP: Tally Joe  REFERRING PROVIDER: Jodi Geralds, MD  REFERRING DIAG: s/p r total knee replacment SURGERY ON 09/19/2022 THERAPY NEEDS TO START 09/21/22  THERAPY DIAG:  Right knee stiffness Edema Decreased strength Rt knee pain   Rationale for Evaluation and Treatment: Rehabilitation  ONSET DATE: 09/19/22  SUBJECTIVE STATEMENT: Margaret Gonzalez states that she had a spinal for her surgery that wore off this morning.   She is sleeping on her couch as she can not get into her bed as it is to high.  The pt states that she has been doing the exercises that they gave her at the hospital.  She is walking with her walker at home.    PERTINENT HISTORY: OA PAIN:  Are you having pain? Yes: NPRS scale: 10+/10 Pain location: Rt knee Pain description: aching and burning  Aggravating factors: activity  Relieving factors: elevate.   PRECAUTIONS: None  WEIGHT BEARING RESTRICTIONS: No  FALLS:  Has patient fallen in last 6 months? No  LIVING ENVIRONMENT: Lives with: lives with their family Lives in: House/apartment Stairs: Yes: External: 4 steps does not need to go up/ 8 steps going upstairs to her bedroom  Has following equipment at home: Dan Humphreys - 2 wheeled  OCCUPATION: nurse   PLOF: Independent  PATIENT GOALS: To walk without pain, be able to play with  her grandchildren    NEXT MD VISIT: 10/02/22  OBJECTIVE:     PATIENT SURVEYS:  FOTO 15  COGNITION: Overall cognitive status: Within functional limits for tasks assessed     SENSATION: WFL   PALPATION: Noted increased edema   LOWER EXTREMITY ROM:  Active ROM Right eval Left eval  Hip flexion    Hip extension    Hip abduction    Hip adduction    Hip internal rotation    Hip external rotation    Knee flexion 58   Knee extension 17   Ankle dorsiflexion    Ankle plantarflexion    Ankle inversion    Ankle eversion     (Blank rows = not  tested)  LOWER EXTREMITY MMT:  MMT Right eval Left eval  Hip flexion Unable to complete SLR without assist    Hip extension    Hip abduction 2   Hip adduction    Hip internal rotation    Hip external rotation    Knee flexion    Knee extension 2   Ankle dorsiflexion    Ankle plantarflexion    Ankle inversion    Ankle eversion     (Blank rows = not tested)    FUNCTIONAL TESTS:  2 minute walk test: 60 ft with walker walk to gait pattern.     TODAY'S TREATMENT:                                                                                                                              DATE: 09/21/22 Eval  - Supine Ankle Pumps x 10 - Supine Quadricep Sets  10 reps - 3" hold - Supine Gluteal Sets  10x - 5" hold - Supine Heel Slide  -  10 reps - 5" hold - Supine March  - 2 needs assist and very painful  Manual to decrease edema   PATIENT EDUCATION:  Education details: HEP self manual to decrease swelling Person educated: Patient Education method: [employer] Technology [employer] Education comprehension: needs further education  HOME EXERCISE PROGRAM: Access Code: [email or web address] URL: [email or web address] Date: 09/21/2022 Prepared by: Virgina Organ  Exercises - Supine Ankle Pumps  - 2 x daily - 7 x weekly - 1 sets - 10 reps - 3" hold - Supine Quadricep Sets  - 2 x daily - 7 x weekly - 1 sets - 10 reps - 3" hold - Supine Gluteal Sets  - 2 x daily - 7 x weekly - 1 sets - 10 reps - 5" hold - Supine Heel Slide  - 2 x daily - 7 x weekly - 1 sets - 10 reps - 5" hold - Supine March  - 1 x daily - 7 x weekly - 3 sets - 10 reps  ASSESSMENT:  CLINICAL IMPRESSION: Patient is a 62 y.o. female  who was seen today for physical therapy evaluation and treatment for a Rt TKR.  Evaluation demonstrates decreased ROM, decreased strength, decreased activity  tolerance, difficulty in walking, increased swelling and increased pain.  Margaret Gonzalez will benefit from skilled PT to address these  issues and maximize her functioning activity.    OBJECTIVE IMPAIRMENTS: decreased balance, difficulty walking, decreased ROM, and decreased strength.   ACTIVITY LIMITATIONS: carrying, lifting, stairs, and locomotion level  PARTICIPATION LIMITATIONS: cleaning, laundry, shopping, community activity, and yard work  [facility] POTENTIAL: Good  CLINICAL DECISION MAKING: Stable/uncomplicated  EVALUATION COMPLEXITY: Low   GOALS: Goals reviewed with patient? No  SHORT TERM GOALS: Target date: 10/19/22 Pt to be I in HEP in order to improve extension to less than 7 to improve gait pattern Baseline: Goal status: INITIAL  2.  PT strength to be improved one grade to allow pt to walk  with a cane Baseline:  Goal status: INITIAL  3.  PT pain level to be no greater than a 5/10 to allow pt to be up for 45 minutes without having to rest.  Baseline:  Goal status: INITIAL  4.  Pt ROM of Rt knee to be to 90 degrees to allow pt to sit in comfort for 30 minutes.     LONG TERM GOALS: Target date: 11/09/22  Pt to be I in advanced  HEP in order to improve extension to less than 3 for improved gait.  Baseline:  Goal status: INITIAL  2.  PT strength to be improved one grade to allow pt to walk without an assistive device Baseline:  Goal status: INITIAL  3.  PT pain level to be no greater than a 2/10 to allow pt to be up for 90 minutes without having to res Baseline:  Goal status: INITIAL  4.   Pt ROM of Rt knee to be to 115 degrees to allow pt squat to pick items off the floor and play with her grandchildren.  Baseline:  Goal status: INITIAL     PLAN:  PT FREQUENCY: 2x/week  PT DURATION: 8 weeks  PLANNED INTERVENTIONS: Therapeutic exercises, Therapeutic activity, Neuromuscular re-education, Balance training, Gait training, Patient/Family education, Self Care, Joint mobilization, and Manual therapy  PLAN FOR NEXT SESSION: Continue to progress ROM, Strength and decrease edema for pain     Virgina Organ, PT CLT [phone]  09/21/2022, 12:14 PM

## 2022-09-21 ENCOUNTER — Ambulatory Visit (HOSPITAL_COMMUNITY): Payer: 59 | Attending: Orthopedic Surgery | Admitting: Physical Therapy

## 2022-09-21 ENCOUNTER — Other Ambulatory Visit: Payer: Self-pay

## 2022-09-21 DIAGNOSIS — M25561 Pain in right knee: Secondary | ICD-10-CM | POA: Diagnosis not present

## 2022-09-21 DIAGNOSIS — M6281 Muscle weakness (generalized): Secondary | ICD-10-CM | POA: Diagnosis not present

## 2022-09-21 DIAGNOSIS — M25661 Stiffness of right knee, not elsewhere classified: Secondary | ICD-10-CM | POA: Diagnosis not present

## 2022-09-21 DIAGNOSIS — R600 Localized edema: Secondary | ICD-10-CM | POA: Insufficient documentation

## 2022-09-25 ENCOUNTER — Ambulatory Visit (HOSPITAL_COMMUNITY): Payer: 59 | Admitting: Physical Therapy

## 2022-09-25 DIAGNOSIS — M25561 Pain in right knee: Secondary | ICD-10-CM

## 2022-09-25 DIAGNOSIS — M6281 Muscle weakness (generalized): Secondary | ICD-10-CM

## 2022-09-25 DIAGNOSIS — M25661 Stiffness of right knee, not elsewhere classified: Secondary | ICD-10-CM | POA: Diagnosis not present

## 2022-09-25 DIAGNOSIS — R600 Localized edema: Secondary | ICD-10-CM

## 2022-09-25 NOTE — Therapy (Signed)
OUTPATIENT PHYSICAL THERAPY LOWER EXTREMITY Treatment   Patient Name: Margaret Gonzalez MRN: [id number] DOB:[birth year], 62 y.o., female Today's Date: 09/25/2022  END OF SESSION:  PT End of Session - 09/25/22 1635     Visit Number 2    Number of Visits 21    Date for PT Re-Evaluation 11/23/22    Authorization Type [employer]    PT Start Time 1555    PT Stop Time 1635    PT Time Calculation (min) 40 min    Activity Tolerance Patient tolerated treatment well    Behavior During Therapy [facility] for tasks assessed/performed             Past Medical History:  Diagnosis Date   Arthritis    Depression    Headache(784.0)    Hypertension    Pre-diabetes    Past Surgical History:  Procedure Laterality Date   CESAREAN SECTION  [phone]   x3   CHOLECYSTECTOMY  1986   CLOSED REDUCTION HUMERUS FRACTURE Left 07/01/2020   Procedure: CLOSED REDUCTION HUMERAL SHAFT;  Surgeon: Vickki Hearing, MD;  Location: [facility];  Service: Orthopedics;  Laterality: Left;   COLONOSCOPY     KNEE ARTHROSCOPY Left 11/12/2012   Procedure: ARTHROSCOPY KNEE ;  Surgeon: Harvie Junior, MD;  Location: [facility];  Service: Orthopedics;  Laterality: Left;  partial medial and lateral menisectomies and chrondroplasty of patella   KNEE ARTHROSCOPY Right    ORIF HUMERUS FRACTURE Left 07/26/2020   Procedure: OPEN REDUCTION INTERNAL FIXATION (ORIF) HUMERAL SHAFT FRACTURE WITH AUTOGRAFT;  Surgeon: Vickki Hearing, MD;  Location: [facility];  Service: Orthopedics;  Laterality: Left;   Patient Active Problem List   Diagnosis Date Noted   Adjustment disorder with mixed emotional features 07/20/2020   Anxiety 07/20/2020   Body mass index (BMI) 39.0-39.9, adult 07/20/2020   Fatty liver 07/20/2020   Hyperlipidemia 07/20/2020   Hypertension 07/20/2020   Iron deficiency anemia 07/20/2020   Recurrent major depression (HCC) 07/20/2020   Type 2 diabetes mellitus with other specified complication (HCC) 07/20/2020    Closed displaced transverse fracture of shaft of left humerus 06/27/2020    PCP: Tally Joe  REFERRING PROVIDER: Jodi Geralds, MD  REFERRING DIAG: s/p r total knee replacment SURGERY ON 09/19/2022 THERAPY NEEDS TO START 09/21/22  THERAPY DIAG:  Right knee stiffness Edema Decreased strength Rt knee pain   Rationale for Evaluation and Treatment: Rehabilitation  ONSET DATE: 09/19/22  SUBJECTIVE STATEMENT:  Pt states that she is doing much better, she has no questions on her exercises.  PERTINENT HISTORY: OA PAIN:  Are you having pain? Yes: NPRS scale:4 /10 Pain location: Rt knee Pain description: aching and burning  Aggravating factors: activity  Relieving factors: elevate.   PRECAUTIONS: None  WEIGHT BEARING RESTRICTIONS: No  FALLS:  Has patient fallen in last 6 months? No  LIVING ENVIRONMENT: Lives with: lives with their family Lives in: House/apartment Stairs: Yes: External: 4 steps does not need to go up/ 8 steps going upstairs to her bedroom  Has following equipment at home: Dan Humphreys - 2 wheeled  OCCUPATION: nurse   PLOF: Independent  PATIENT GOALS: To walk without pain, be able to play with her grandchildren    NEXT MD VISIT: 10/02/22  OBJECTIVE:     PATIENT SURVEYS:  FOTO 15  COGNITION: Overall cognitive status: Within functional limits for tasks assessed     SENSATION: WFL   PALPATION: Noted increased edema   LOWER EXTREMITY ROM:  Active  ROM Right eval 09/25/22 Left eval  Hip flexion     Hip extension     Hip abduction     Hip adduction     Hip internal rotation     Hip external rotation     Knee flexion 58 103   Knee extension 17 10   Ankle dorsiflexion     Ankle plantarflexion     Ankle inversion     Ankle eversion      (Blank rows = not tested)  LOWER EXTREMITY MMT:  MMT Right eval Left eval  Hip flexion Unable to complete SLR without assist    Hip extension    Hip abduction 2   Hip adduction    Hip internal rotation     Hip external rotation    Knee flexion    Knee extension 2   Ankle dorsiflexion    Ankle plantarflexion    Ankle inversion    Ankle eversion     (Blank rows = not tested)    FUNCTIONAL TESTS:  2 minute walk test: 60 ft with walker walk to gait pattern.     TODAY'S TREATMENT:                                                                                                                              DATE:  09/25/22 Standing : Heel raise x 10 Functional squat x 10  Sitting: Long arc quad x 10  Supine: Quad set x 10 SAQ x 10  Heel slide x 5 SLR x 5  2 sets   Bridge 5 x 2 sets  Side lying hip abduction x 10  Manual to decrease pain and edema  09/21/22 Eval  - Supine Ankle Pumps x 10 - Supine Quadricep Sets  10 reps - 3" hold - Supine Gluteal Sets  10x - 5" hold - Supine Heel Slide  -  10 reps - 5" hold - Supine March  - 2 needs assist and very painful  Manual to decrease edema   PATIENT EDUCATION:  Education details: HEP self manual to decrease swelling Person educated: Patient Education method: [employer] Education comprehension: needs further education  HOME EXERCISE PROGRAM: Access Code: [email or web address] URL: [email or web address] Date: 09/21/2022 Prepared by: Virgina Organ 09/25/22 Supine Active Straight Leg Raise  - 2 x daily - 7 x weekly - 1 sets - 10 reps - 3" hold - Supine Bridge  - 2 x daily - 7 x weekly - 1 sets - 10 reps - 3" hold - Sidelying Hip Abduction  - 2 x daily - 7 x weekly - 1 sets - 10 reps - 3" hold Exercises - Supine Ankle Pumps  - 2 x daily - 7 x weekly - 1 sets - 10 reps - 3" hold - Supine Quadricep Sets  - 2 x daily - 7 x weekly - 1 sets - 10 reps - 3" hold - Supine Gluteal Sets  - 2  x daily - 7 x weekly - 1 sets - 10 reps - 5" hold - Supine Heel Slide  - 2 x daily - 7 x weekly - 1 sets - 10 reps - 5" hold - Supine March  - 1 x daily - 7 x weekly - 3 sets - 10 reps  ASSESSMENT:  CLINICAL IMPRESSION:   Evaluation and goals reviewed with patient.  PT is improving but continues to  demonstrates decreased ROM, decreased strength, decreased activity tolerance, difficulty in walking, increased swelling and increased pain.  Pt HEP updated, Margaret Gonzalez will benefit from skilled PT to address these issues and maximize her functioning activity.    OBJECTIVE IMPAIRMENTS: decreased balance, difficulty walking, decreased ROM, and decreased strength.   ACTIVITY LIMITATIONS: carrying, lifting, stairs, and locomotion level  PARTICIPATION LIMITATIONS: cleaning, laundry, shopping, community activity, and yard work  [facility] POTENTIAL: Good  CLINICAL DECISION MAKING: Stable/uncomplicated  EVALUATION COMPLEXITY: Low   GOALS: Goals reviewed with patient? No  SHORT TERM GOALS: Target date: 10/19/22 Pt to be I in HEP in order to improve extension to less than 7 to improve gait pattern Baseline: Goal status: IN PROGRESS  2.  PT strength to be improved one grade to allow pt to walk  with a cane Baseline:  Goal status: IN PROGRESS  3.  PT pain level to be no greater than a 5/10 to allow pt to be up for 45 minutes without having to rest.  Baseline:  Goal status: IN PROGRESS  4.  Pt ROM of Rt knee to be to 90 degrees to allow pt to sit in comfort for 30 minutes.     LONG TERM GOALS: Target date: 11/09/22  Pt to be I in advanced  HEP in order to improve extension to less than 3 for improved gait.  Baseline:  Goal status: IN PROGRESS  2.  PT strength to be improved one grade to allow pt to walk without an assistive device Baseline:  Goal status: IN PROGRESS  3.  PT pain level to be no greater than a 2/10 to allow pt to be up for 90 minutes without having to res Baseline:  Goal status: IN PROGRESS  4.   Pt ROM of Rt knee to be to 115 degrees to allow pt squat to pick items off the floor and play with her grandchildren.  Baseline:  Goal status: IN PROGRESS     PLAN:  PT FREQUENCY: 2x/week  PT  DURATION: 8 weeks  PLANNED INTERVENTIONS: Therapeutic exercises, Therapeutic activity, Neuromuscular re-education, Balance training, Gait training, Patient/Family education, Self Care, Joint mobilization, and Manual therapy  PLAN FOR NEXT SESSION: Continue to progress ROM, Strength and decrease edema for pain    Virgina Organ, PT CLT [phone]  09/25/2022, 4:38 PM

## 2022-09-26 ENCOUNTER — Telehealth (HOSPITAL_COMMUNITY): Payer: Self-pay | Admitting: Physical Therapy

## 2022-09-26 NOTE — Telephone Encounter (Signed)
Patient came 09/25/22 and stated that 2x's a week is enough for her she has transportation issues and Tuesday and Thursday are the days she can come. Patient asked me again to make sure she only had Tuesday and Thursday and I printed her schedule. Patient asked that we don't schedule her 3x's a week. And remove her from the wait list

## 2022-09-27 ENCOUNTER — Encounter (HOSPITAL_COMMUNITY): Payer: 59 | Admitting: Physical Therapy

## 2022-10-01 ENCOUNTER — Encounter (HOSPITAL_COMMUNITY): Payer: 59 | Admitting: Physical Therapy

## 2022-10-01 ENCOUNTER — Encounter (HOSPITAL_COMMUNITY): Payer: 59

## 2022-10-03 ENCOUNTER — Encounter (HOSPITAL_COMMUNITY): Payer: 59 | Admitting: Physical Therapy

## 2022-10-04 ENCOUNTER — Encounter (HOSPITAL_COMMUNITY): Payer: Self-pay

## 2022-10-04 ENCOUNTER — Ambulatory Visit (HOSPITAL_COMMUNITY): Payer: 59 | Attending: Orthopedic Surgery

## 2022-10-04 DIAGNOSIS — M25561 Pain in right knee: Secondary | ICD-10-CM | POA: Insufficient documentation

## 2022-10-04 DIAGNOSIS — R600 Localized edema: Secondary | ICD-10-CM | POA: Insufficient documentation

## 2022-10-04 DIAGNOSIS — M25661 Stiffness of right knee, not elsewhere classified: Secondary | ICD-10-CM | POA: Insufficient documentation

## 2022-10-04 DIAGNOSIS — M6281 Muscle weakness (generalized): Secondary | ICD-10-CM | POA: Diagnosis not present

## 2022-10-04 NOTE — Therapy (Signed)
OUTPATIENT PHYSICAL THERAPY LOWER EXTREMITY Treatment   Patient Name: Margaret Gonzalez MRN: [id number] DOB:December 22, 1960, 62 y.o., female Today's Date: 10/04/2022  END OF SESSION: END OF SESSION:   PT End of Session - 10/04/22 1312     Visit Number 3    Number of Visits 21    Date for PT Re-Evaluation 11/23/22    Authorization Type [place]    PT Start Time 1304    PT Stop Time 1342    PT Time Calculation (min) 38 min    Activity Tolerance Patient tolerated treatment well    Behavior During Therapy WFL for tasks assessed/performed            Past Medical History:  Diagnosis Date   Arthritis    Depression    Headache(784.0)    Hypertension    Pre-diabetes    Past Surgical History:  Procedure Laterality Date   CESAREAN SECTION  [phone]   x3   CHOLECYSTECTOMY  1986   CLOSED REDUCTION HUMERUS FRACTURE Left 07/01/2020   Procedure: CLOSED REDUCTION HUMERAL SHAFT;  Surgeon: Vickki Hearing, MD;  Location: [facility];  Service: Orthopedics;  Laterality: Left;   COLONOSCOPY     KNEE ARTHROSCOPY Left 11/12/2012   Procedure: ARTHROSCOPY KNEE ;  Surgeon: Harvie Junior, MD;  Location: [facility];  Service: Orthopedics;  Laterality: Left;  partial medial and lateral menisectomies and chrondroplasty of patella   KNEE ARTHROSCOPY Right    ORIF HUMERUS FRACTURE Left 07/26/2020   Procedure: OPEN REDUCTION INTERNAL FIXATION (ORIF) HUMERAL SHAFT FRACTURE WITH AUTOGRAFT;  Surgeon: Vickki Hearing, MD;  Location: [facility];  Service: Orthopedics;  Laterality: Left;   Patient Active Problem List   Diagnosis Date Noted   Adjustment disorder with mixed emotional features 07/20/2020   Anxiety 07/20/2020   Body mass index (BMI) 39.0-39.9, adult 07/20/2020   Fatty liver 07/20/2020   Hyperlipidemia 07/20/2020   Hypertension 07/20/2020   Iron deficiency anemia 07/20/2020   Recurrent major depression (HCC) 07/20/2020   Type 2 diabetes mellitus with other specified complication (HCC)  07/20/2020   Closed displaced transverse fracture of shaft of left humerus 06/27/2020    PCP: Tally Joe  REFERRING PROVIDER: Jodi Geralds, MD  REFERRING DIAG: s/p r total knee replacment SURGERY ON 09/19/2022 THERAPY NEEDS TO START 09/21/22  THERAPY DIAG:  Right knee stiffness Edema Decreased strength Rt knee pain   Rationale for Evaluation and Treatment: Rehabilitation  ONSET DATE: 09/19/22  SUBJECTIVE STATEMENT:   Saw MD yesterday and happy with progress, replaced bandages.  Reports she went for walk outside in neighborhood yesterday with [facility] for about a half mile.  Walks inside without AD. PERTINENT HISTORY: OA PAIN:  Are you having pain? Yes: NPRS scale:7 /10 Pain location: Rt knee Pain description: aching and burning  Aggravating factors: activity  Relieving factors: elevate.   PRECAUTIONS: None  WEIGHT BEARING RESTRICTIONS: No  FALLS:  Has patient fallen in last 6 months? No  LIVING ENVIRONMENT: Lives with: lives with their family Lives in: House/apartment Stairs: Yes: External: 4 steps does not need to go up/ 8 steps going upstairs to her bedroom  Has following equipment at home: Dan Humphreys - 2 wheeled  OCCUPATION: nurse   PLOF: Independent  PATIENT GOALS: To walk without pain, be able to play with her grandchildren    NEXT MD VISIT: 10/30/22  OBJECTIVE:     PATIENT SURVEYS:  FOTO 15  COGNITION: Overall cognitive status: Within functional limits for tasks assessed  SENSATION: WFL   PALPATION: Noted increased edema   LOWER EXTREMITY ROM:  Active ROM Right eval 09/25/22 Left eval Right 10/04/22  Hip flexion      Hip extension      Hip abduction      Hip adduction      Hip internal rotation      Hip external rotation      Knee flexion 58 103  113  Knee extension 17 10  7   Ankle dorsiflexion      Ankle plantarflexion      Ankle inversion      Ankle eversion       (Blank rows = not tested)  LOWER EXTREMITY MMT:  MMT Right eval  Left eval  Hip flexion Unable to complete SLR without assist    Hip extension    Hip abduction 2   Hip adduction    Hip internal rotation    Hip external rotation    Knee flexion    Knee extension 2   Ankle dorsiflexion    Ankle plantarflexion    Ankle inversion    Ankle eversion     (Blank rows = not tested)    FUNCTIONAL TESTS:  2 minute walk test: 60 ft with walker walk to gait pattern.     TODAY'S TREATMENT:                                                                                                                              DATE:  10/04/22 Bike seat 12 full revolution initially backwards then forward Heel raise incline slope 15x Toe raise decline slope 15x TKE GTB 10 x 5" Knee drive 29BM step height 5x 10" holds Slant board 3x 30" Functional Squat 10x Supine: manual to decrease pain and edema  Quad sets 10x Heel slide 10x AROM 7-113  09/25/22 Standing : Heel raise x 10 Functional squat x 10  Sitting: Long arc quad x 10  Supine: Quad set x 10 SAQ x 10  Heel slide x 5 SLR x 5  2 sets   Bridge 5 x 2 sets  Side lying hip abduction x 10  Manual to decrease pain and edema  09/21/22 Eval  - Supine Ankle Pumps x 10 - Supine Quadricep Sets  10 reps - 3" hold - Supine Gluteal Sets  10x - 5" hold - Supine Heel Slide  -  10 reps - 5" hold - Supine March  - 2 needs assist and very painful  Manual to decrease edema   PATIENT EDUCATION:  Education details: HEP self manual to decrease swelling Person educated: Patient Education method: [employer] Technology [employer] Education comprehension: needs further education  HOME EXERCISE PROGRAM: Access Code: [email or web address] URL: [email or web address] Date: 09/21/2022 Prepared by: Virgina Organ 09/25/22 Supine Active Straight Leg Raise  - 2 x daily - 7 x weekly - 1 sets - 10 reps - 3" hold - Supine Bridge  - 2 x daily -  7 x weekly - 1 sets - 10 reps - 3" hold - Sidelying Hip Abduction  - 2 x daily - 7 x  weekly - 1 sets - 10 reps - 3" hold Exercises - Supine Ankle Pumps  - 2 x daily - 7 x weekly - 1 sets - 10 reps - 3" hold - Supine Quadricep Sets  - 2 x daily - 7 x weekly - 1 sets - 10 reps - 3" hold - Supine Gluteal Sets  - 2 x daily - 7 x weekly - 1 sets - 10 reps - 5" hold - Supine Heel Slide  - 2 x daily - 7 x weekly - 1 sets - 10 reps - 5" hold - Supine March  - 1 x daily - 7 x weekly - 3 sets - 10 reps  ASSESSMENT:  CLINICAL IMPRESSION: Session focus with knee mobility, edema control and strengthening.  Added TKE based strengthening and LE strengthes to assist with ROM.  Manual decongestive techniques complete to address edema and pain control.  Improved AROM 7-113 degrees.  Reviewed current HEP compliance.  OBJECTIVE IMPAIRMENTS: decreased balance, difficulty walking, decreased ROM, and decreased strength.   ACTIVITY LIMITATIONS: carrying, lifting, stairs, and locomotion level  PARTICIPATION LIMITATIONS: cleaning, laundry, shopping, community activity, and yard work  [facility] POTENTIAL: Good  CLINICAL DECISION MAKING: Stable/uncomplicated  EVALUATION COMPLEXITY: Low   GOALS: Goals reviewed with patient? No  SHORT TERM GOALS: Target date: 10/19/22 Pt to be I in HEP in order to improve extension to less than 7 to improve gait pattern Baseline: Goal status: IN PROGRESS  2.  PT strength to be improved one grade to allow pt to walk  with a cane Baseline:  Goal status: IN PROGRESS  3.  PT pain level to be no greater than a 5/10 to allow pt to be up for 45 minutes without having to rest.  Baseline:  Goal status: IN PROGRESS  4.  Pt ROM of Rt knee to be to 90 degrees to allow pt to sit in comfort for 30 minutes.     LONG TERM GOALS: Target date: 11/09/22  Pt to be I in advanced  HEP in order to improve extension to less than 3 for improved gait.  Baseline:  Goal status: IN PROGRESS  2.  PT strength to be improved one grade to allow pt to walk without an assistive  device Baseline:  Goal status: IN PROGRESS  3.  PT pain level to be no greater than a 2/10 to allow pt to be up for 90 minutes without having to res Baseline:  Goal status: IN PROGRESS  4.   Pt ROM of Rt knee to be to 115 degrees to allow pt squat to pick items off the floor and play with her grandchildren.  Baseline:  Goal status: IN PROGRESS     PLAN:  PT FREQUENCY: 2x/week  PT DURATION: 8 weeks  PLANNED INTERVENTIONS: Therapeutic exercises, Therapeutic activity, Neuromuscular re-education, Balance training, Gait training, Patient/Family education, Self Care, Joint mobilization, and Manual therapy  PLAN FOR NEXT SESSION: Continue to progress ROM, Strength and decrease edema for pain   Becky Sax, LPTA/CLT; CBIS [phone]  Juel Burrow, PTA 10/04/2022, 4:43 PM  10/04/2022, 4:41 PM

## 2022-10-08 ENCOUNTER — Encounter (HOSPITAL_COMMUNITY): Payer: 59

## 2022-10-08 ENCOUNTER — Other Ambulatory Visit: Payer: Self-pay

## 2022-10-09 ENCOUNTER — Encounter (HOSPITAL_COMMUNITY): Payer: 59

## 2022-10-11 ENCOUNTER — Ambulatory Visit (HOSPITAL_COMMUNITY): Payer: 59

## 2022-10-11 DIAGNOSIS — M25561 Pain in right knee: Secondary | ICD-10-CM | POA: Diagnosis not present

## 2022-10-11 DIAGNOSIS — R600 Localized edema: Secondary | ICD-10-CM | POA: Diagnosis not present

## 2022-10-11 DIAGNOSIS — M6281 Muscle weakness (generalized): Secondary | ICD-10-CM | POA: Diagnosis not present

## 2022-10-11 DIAGNOSIS — M25661 Stiffness of right knee, not elsewhere classified: Secondary | ICD-10-CM

## 2022-10-11 NOTE — Therapy (Signed)
OUTPATIENT PHYSICAL THERAPY LOWER EXTREMITY Treatment   Patient Name: Margaret Gonzalez MRN: [id number] DOB:04/16/1961, 62 y.o., female Today's Date: 10/11/2022  END OF SESSION: END OF SESSION:   PT End of Session - 10/11/22 1344     Visit Number 4    Number of Visits 21    Date for PT Re-Evaluation 11/23/22    Authorization Type [place]    PT Start Time 1344    PT Stop Time 1425    PT Time Calculation (min) 41 min    Activity Tolerance Patient tolerated treatment well    Behavior During Therapy [facility] for tasks assessed/performed            Past Medical History:  Diagnosis Date   Arthritis    Depression    Headache(784.0)    Hypertension    Pre-diabetes    Past Surgical History:  Procedure Laterality Date   CESAREAN SECTION  [phone]   x3   CHOLECYSTECTOMY  1986   CLOSED REDUCTION HUMERUS FRACTURE Left 07/01/2020   Procedure: CLOSED REDUCTION HUMERAL SHAFT;  Surgeon: Vickki Hearing, MD;  Location: [facility];  Service: Orthopedics;  Laterality: Left;   COLONOSCOPY     KNEE ARTHROSCOPY Left 11/12/2012   Procedure: ARTHROSCOPY KNEE ;  Surgeon: Harvie Junior, MD;  Location: [facility];  Service: Orthopedics;  Laterality: Left;  partial medial and lateral menisectomies and chrondroplasty of patella   KNEE ARTHROSCOPY Right    ORIF HUMERUS FRACTURE Left 07/26/2020   Procedure: OPEN REDUCTION INTERNAL FIXATION (ORIF) HUMERAL SHAFT FRACTURE WITH AUTOGRAFT;  Surgeon: Vickki Hearing, MD;  Location: [facility];  Service: Orthopedics;  Laterality: Left;   Patient Active Problem List   Diagnosis Date Noted   Adjustment disorder with mixed emotional features 07/20/2020   Anxiety 07/20/2020   Body mass index (BMI) 39.0-39.9, adult 07/20/2020   Fatty liver 07/20/2020   Hyperlipidemia 07/20/2020   Hypertension 07/20/2020   Iron deficiency anemia 07/20/2020   Recurrent major depression (HCC) 07/20/2020   Type 2 diabetes mellitus with other specified complication  (HCC) 07/20/2020   Closed displaced transverse fracture of shaft of left humerus 06/27/2020    PCP: Tally Joe  REFERRING PROVIDER: Jodi Geralds, MD  REFERRING DIAG: s/p r total knee replacment SURGERY ON 09/19/2022 THERAPY NEEDS TO START 09/21/22  THERAPY DIAG:  Right knee stiffness Edema Decreased strength Rt knee pain   Rationale for Evaluation and Treatment: Rehabilitation  ONSET DATE: 09/19/22  SUBJECTIVE STATEMENT:   Has been sick for the past couple days so her knee is stiff and hurting today; 2-3/10 pain  PERTINENT HISTORY: OA PAIN:  Are you having pain? Yes: NPRS scale:7 /10 Pain location: Rt knee Pain description: aching and burning  Aggravating factors: activity  Relieving factors: elevate.   PRECAUTIONS: None  WEIGHT BEARING RESTRICTIONS: No  FALLS:  Has patient fallen in last 6 months? No  LIVING ENVIRONMENT: Lives with: lives with their family Lives in: House/apartment Stairs: Yes: External: 4 steps does not need to go up/ 8 steps going upstairs to her bedroom  Has following equipment at home: Dan Humphreys - 2 wheeled  OCCUPATION: nurse   PLOF: Independent  PATIENT GOALS: To walk without pain, be able to play with her grandchildren    NEXT MD VISIT: 10/30/22  OBJECTIVE:     PATIENT SURVEYS:  FOTO 15  COGNITION: Overall cognitive status: Within functional limits for tasks assessed     SENSATION: WFL   PALPATION: Noted increased edema  LOWER EXTREMITY ROM:  Active ROM Right eval 09/25/22 Left eval Right 10/04/22 Right 10/11/22  Hip flexion       Hip extension       Hip abduction       Hip adduction       Hip internal rotation       Hip external rotation       Knee flexion 58 103  113 115  Knee extension 17 10 -4 (lacking) 7 -6 (lacking)  Ankle dorsiflexion       Ankle plantarflexion       Ankle inversion       Ankle eversion        (Blank rows = not tested)  LOWER EXTREMITY MMT:  MMT Right eval Left eval  Hip flexion  Unable to complete SLR without assist    Hip extension    Hip abduction 2   Hip adduction    Hip internal rotation    Hip external rotation    Knee flexion    Knee extension 2   Ankle dorsiflexion    Ankle plantarflexion    Ankle inversion    Ankle eversion     (Blank rows = not tested)    FUNCTIONAL TESTS:  2 minute walk test: 60 ft with walker walk to gait pattern.     TODAY'S TREATMENT:                                                                                                                              DATE:  10/11/22 Knee drives for flexion x 2' on 12" box  Heel/toe raises x 20 Slant board 5 x 20" Bike seat 12 x 5' full forward revolutions for mobility Functional squat 2 x 10 TKE's green theraband x 20 Hip abduction x 15 IT band stretch in standing x 20" AROM right knee in supine -6 to 115  10/04/22 Bike seat 12 full revolution initially backwards then forward Heel raise incline slope 15x Toe raise decline slope 15x TKE GTB 10 x 5" Knee drive 16XW step height 5x 10" holds Slant board 3x 30" Functional Squat 10x Supine: manual to decrease pain and edema  Quad sets 10x Heel slide 10x AROM 7-113  09/25/22 Standing : Heel raise x 10 Functional squat x 10  Sitting: Long arc quad x 10  Supine: Quad set x 10 SAQ x 10  Heel slide x 5 SLR x 5  2 sets   Bridge 5 x 2 sets  Side lying hip abduction x 10  Manual to decrease pain and edema  09/21/22 Eval  - Supine Ankle Pumps x 10 - Supine Quadricep Sets  10 reps - 3" hold - Supine Gluteal Sets  10x - 5" hold - Supine Heel Slide  -  10 reps - 5" hold - Supine March  - 2 needs assist and very painful  Manual to decrease edema   PATIENT EDUCATION:  Education details: HEP  self manual to decrease swelling Person educated: Patient Education method: Explanation and Handouts Education comprehension: needs further education  HOME EXERCISE PROGRAM: Access Code: [email or web address] URL:  [email or web address] Date: 09/21/2022 Prepared by: Virgina Organ 09/25/22 Supine Active Straight Leg Raise  - 2 x daily - 7 x weekly - 1 sets - 10 reps - 3" hold - Supine Bridge  - 2 x daily - 7 x weekly - 1 sets - 10 reps - 3" hold - Sidelying Hip Abduction  - 2 x daily - 7 x weekly - 1 sets - 10 reps - 3" hold Exercises - Supine Ankle Pumps  - 2 x daily - 7 x weekly - 1 sets - 10 reps - 3" hold - Supine Quadricep Sets  - 2 x daily - 7 x weekly - 1 sets - 10 reps - 3" hold - Supine Gluteal Sets  - 2 x daily - 7 x weekly - 1 sets - 10 reps - 5" hold - Supine Heel Slide  - 2 x daily - 7 x weekly - 1 sets - 10 reps - 5" hold - Supine March  - 1 x daily - 7 x weekly - 3 sets - 10 reps  ASSESSMENT:  CLINICAL IMPRESSION: Today's session continued with working on right knee mobility and strength.  Patient with some pain lateral joint line area today; gets some sharp pain occasionally.  Normalizing gait pattern noted without AD. Patient with noted swelling lateral knee likely causing increased lateral joint pain today; added IT band stretch to address lateral knee pain.   Patient will benefit from continued skilled therapy services during the remainder of her hospital stay and at the next recommended venue of care to address deficits and promote return to optimal function.      OBJECTIVE IMPAIRMENTS: decreased balance, difficulty walking, decreased ROM, and decreased strength.   ACTIVITY LIMITATIONS: carrying, lifting, stairs, and locomotion level  PARTICIPATION LIMITATIONS: cleaning, laundry, shopping, community activity, and yard work  [facility] POTENTIAL: Good  CLINICAL DECISION MAKING: Stable/uncomplicated  EVALUATION COMPLEXITY: Low   GOALS: Goals reviewed with patient? No  SHORT TERM GOALS: Target date: 10/19/22 Pt to be I in HEP in order to improve extension to less than 7 to improve gait pattern Baseline: Goal status: IN PROGRESS  2.  PT strength to be improved  one grade to allow pt to walk  with a cane Baseline:  Goal status: IN PROGRESS  3.  PT pain level to be no greater than a 5/10 to allow pt to be up for 45 minutes without having to rest.  Baseline:  Goal status: IN PROGRESS  4.  Pt ROM of Rt knee to be to 90 degrees to allow pt to sit in comfort for 30 minutes.     LONG TERM GOALS: Target date: 11/09/22  Pt to be I in advanced  HEP in order to improve extension to less than 3 for improved gait.  Baseline:  Goal status: IN PROGRESS  2.  PT strength to be improved one grade to allow pt to walk without an assistive device Baseline:  Goal status: IN PROGRESS  3.  PT pain level to be no greater than a 2/10 to allow pt to be up for 90 minutes without having to res Baseline:  Goal status: IN PROGRESS  4.   Pt ROM of Rt knee to be to 115 degrees to allow pt squat to pick items off the floor and play with her grandchildren.  Baseline:  Goal status: IN PROGRESS     PLAN:  PT FREQUENCY: 2x/week  PT DURATION: 8 weeks  PLANNED INTERVENTIONS: Therapeutic exercises, Therapeutic activity, Neuromuscular re-education, Balance training, Gait training, Patient/Family education, Self Care, Joint mobilization, and Manual therapy  PLAN FOR NEXT SESSION: Continue to progress ROM, Strength and decrease edema for pain   2:31 PM, 10/11/22 Soila Printup Small Jailon Schaible MPT  physical therapy  [phone] Ph:[phone]

## 2022-10-15 ENCOUNTER — Encounter (HOSPITAL_COMMUNITY): Payer: 59

## 2022-10-16 ENCOUNTER — Ambulatory Visit (HOSPITAL_COMMUNITY): Payer: 59

## 2022-10-16 ENCOUNTER — Encounter (HOSPITAL_COMMUNITY): Payer: Self-pay

## 2022-10-16 DIAGNOSIS — M25561 Pain in right knee: Secondary | ICD-10-CM

## 2022-10-16 DIAGNOSIS — M6281 Muscle weakness (generalized): Secondary | ICD-10-CM

## 2022-10-16 DIAGNOSIS — M25661 Stiffness of right knee, not elsewhere classified: Secondary | ICD-10-CM

## 2022-10-16 DIAGNOSIS — R600 Localized edema: Secondary | ICD-10-CM

## 2022-10-16 NOTE — Therapy (Signed)
OUTPATIENT PHYSICAL THERAPY LOWER EXTREMITY Treatment   Patient Name: Margaret Gonzalez MRN: [id number] DOB:12-11-1960, 62 y.o., female Today's Date: 10/16/2022  END OF SESSION: END OF SESSION:   PT End of Session - 10/16/22 1448     Visit Number 5    Number of Visits 21    Date for PT Re-Evaluation 11/23/22    Authorization Type [place]    PT Start Time 1448    PT Stop Time 1532    PT Time Calculation (min) 44 min    Activity Tolerance Patient tolerated treatment well    Behavior During Therapy [facility] for tasks assessed/performed            Past Medical History:  Diagnosis Date   Arthritis    Depression    Headache(784.0)    Hypertension    Pre-diabetes    Past Surgical History:  Procedure Laterality Date   CESAREAN SECTION  [phone]   x3   CHOLECYSTECTOMY  1986   CLOSED REDUCTION HUMERUS FRACTURE Left 07/01/2020   Procedure: CLOSED REDUCTION HUMERAL SHAFT;  Surgeon: Vickki Hearing, MD;  Location: [facility];  Service: Orthopedics;  Laterality: Left;   COLONOSCOPY     KNEE ARTHROSCOPY Left 11/12/2012   Procedure: ARTHROSCOPY KNEE ;  Surgeon: Harvie Junior, MD;  Location: [facility];  Service: Orthopedics;  Laterality: Left;  partial medial and lateral menisectomies and chrondroplasty of patella   KNEE ARTHROSCOPY Right    ORIF HUMERUS FRACTURE Left 07/26/2020   Procedure: OPEN REDUCTION INTERNAL FIXATION (ORIF) HUMERAL SHAFT FRACTURE WITH AUTOGRAFT;  Surgeon: Vickki Hearing, MD;  Location: [facility];  Service: Orthopedics;  Laterality: Left;   Patient Active Problem List   Diagnosis Date Noted   Adjustment disorder with mixed emotional features 07/20/2020   Anxiety 07/20/2020   Body mass index (BMI) 39.0-39.9, adult 07/20/2020   Fatty liver 07/20/2020   Hyperlipidemia 07/20/2020   Hypertension 07/20/2020   Iron deficiency anemia 07/20/2020   Recurrent major depression (HCC) 07/20/2020   Type 2 diabetes mellitus with other specified complication  (HCC) 07/20/2020   Closed displaced transverse fracture of shaft of left humerus 06/27/2020    PCP: Tally Joe  REFERRING PROVIDER: Jodi Geralds, MD  REFERRING DIAG: s/p r total knee replacment SURGERY ON 09/19/2022 THERAPY NEEDS TO START 09/21/22  THERAPY DIAG:  Right knee stiffness Edema Decreased strength Rt knee pain   Rationale for Evaluation and Treatment: Rehabilitation  ONSET DATE: 09/19/22  SUBJECTIVE STATEMENT:   Pt reports increased pain on lateral aspect of leg with radicular symptoms down to foot.  Does continue to be limited with swelling, has been icing regularly.   PERTINENT HISTORY: OA PAIN:  Are you having pain? Yes: NPRS scale:6 /10 Pain location: Rt knee Pain description: aching and burning  Aggravating factors: activity  Relieving factors: elevate.   PRECAUTIONS: None  WEIGHT BEARING RESTRICTIONS: No  FALLS:  Has patient fallen in last 6 months? No  LIVING ENVIRONMENT: Lives with: lives with their family Lives in: House/apartment Stairs: Yes: External: 4 steps does not need to go up/ 8 steps going upstairs to her bedroom  Has following equipment at home: Dan Humphreys - 2 wheeled  OCCUPATION: nurse   PLOF: Independent  PATIENT GOALS: To walk without pain, be able to play with her grandchildren    NEXT MD VISIT: 10/30/22; RTW 10/31/22  OBJECTIVE:     PATIENT SURVEYS:  FOTO 15  COGNITION: Overall cognitive status: Within functional limits for tasks assessed  SENSATION: WFL   PALPATION: Noted increased edema   LOWER EXTREMITY ROM:  Active ROM Right eval 09/25/22 Left eval Right 10/04/22 Right 10/11/22 Right 10/16/22  Hip flexion        Hip extension        Hip abduction        Hip adduction        Hip internal rotation        Hip external rotation        Knee flexion 58 103  113 115   Knee extension 17 10 -4 (lacking) 7 -6 (lacking) 6 lacking   Ankle dorsiflexion        Ankle plantarflexion        Ankle inversion         Ankle eversion         (Blank rows = not tested)  LOWER EXTREMITY MMT:  MMT Right eval Left eval  Hip flexion Unable to complete SLR without assist    Hip extension    Hip abduction 2   Hip adduction    Hip internal rotation    Hip external rotation    Knee flexion    Knee extension 2   Ankle dorsiflexion    Ankle plantarflexion    Ankle inversion    Ankle eversion     (Blank rows = not tested)    FUNCTIONAL TESTS:  2 minute walk test: 60 ft with walker walk to gait pattern.     TODAY'S TREATMENT:                                                                                                                              DATE:  10/16/22:   Bike for mobility for flexion Supine: Manual retrograde massage Patella mobs STM to ITB and lateral knee Bridge with ball squeeze 10x 5" AROM 6-118 degrees  Prone: TKE 10x 5" Prone knee hang x 2 min Standing: Squat Knee drive on 62ZH step 5x 10" Hamstring stretch 3x 30" on 12in step Gastroc stretch 2x 30"   10/11/22 Knee drives for flexion x 2' on 12" box  Heel/toe raises x 20 Slant board 5 x 20" Bike seat 12 x 5' full forward revolutions for mobility Functional squat 2 x 10 TKE's green theraband x 20 Hip abduction x 15 IT band stretch in standing x 20" AROM right knee in supine -6 to 115  10/04/22 Bike seat 12 full revolution initially backwards then forward Heel raise incline slope 15x Toe raise decline slope 15x TKE GTB 10 x 5" Knee drive 08MV step height 5x 10" holds Slant board 3x 30" Functional Squat 10x Supine: manual to decrease pain and edema  Quad sets 10x Heel slide 10x AROM 7-113  09/25/22 Standing : Heel raise x 10 Functional squat x 10  Sitting: Long arc quad x 10  Supine: Quad set x 10 SAQ x 10  Heel slide x 5 SLR x 5  2 sets   Bridge 5 x 2 sets  Side lying hip abduction x 10  Manual to decrease pain and edema  09/21/22 Eval  - Supine Ankle Pumps x 10 - Supine Quadricep Sets  10  reps - 3" hold - Supine Gluteal Sets  10x - 5" hold - Supine Heel Slide  -  10 reps - 5" hold - Supine March  - 2 needs assist and very painful  Manual to decrease edema   PATIENT EDUCATION:  Education details: HEP self manual to decrease swelling Person educated: Patient Education method: [employer] Education comprehension: needs further education  HOME EXERCISE PROGRAM: Access Code: [email or web address] URL: [email or web address] Date: 09/21/2022 Prepared by: Virgina Organ 09/25/22 Supine Active Straight Leg Raise  - 2 x daily - 7 x weekly - 1 sets - 10 reps - 3" hold - Supine Bridge  - 2 x daily - 7 x weekly - 1 sets - 10 reps - 3" hold - Sidelying Hip Abduction  - 2 x daily - 7 x weekly - 1 sets - 10 reps - 3" hold Exercises - Supine Ankle Pumps  - 2 x daily - 7 x weekly - 1 sets - 10 reps - 3" hold - Supine Quadricep Sets  - 2 x daily - 7 x weekly - 1 sets - 10 reps - 3" hold - Supine Gluteal Sets  - 2 x daily - 7 x weekly - 1 sets - 10 reps - 5" hold - Supine Heel Slide  - 2 x daily - 7 x weekly - 1 sets - 10 reps - 5" hold - Supine March  - 1 x daily - 7 x weekly - 3 sets - 10 reps  - Prone Knee Extension Hang  - 1 x daily - 7 x weekly - 3 sets - 2 reps - 2', increase as able hold - Standing ITB Stretch  - 1 x daily - 7 x weekly - 3 sets - 3 reps - 30" hold - Standing Gastroc Stretch at Counter  - 1 x daily - 7 x weekly - 3 sets - 3 reps - 30" hold  ASSESSMENT:  CLINICAL IMPRESSION: Pt limited by pain on lateral aspect this session.  Session focus with knee mobility, strength and pain control.  Manual retrograde massage complete to address edema proximal knee with noted limited superior/inferior patella mobility.  Added prone exercises and stretches to assist with knee AROM.  Added prone knee hang, ITB and gastroc stretch to HEP.     OBJECTIVE IMPAIRMENTS: decreased balance, difficulty walking, decreased ROM, and decreased strength.   ACTIVITY  LIMITATIONS: carrying, lifting, stairs, and locomotion level  PARTICIPATION LIMITATIONS: cleaning, laundry, shopping, community activity, and yard work  [facility] POTENTIAL: Good  CLINICAL DECISION MAKING: Stable/uncomplicated  EVALUATION COMPLEXITY: Low   GOALS: Goals reviewed with patient? No  SHORT TERM GOALS: Target date: 10/19/22 Pt to be I in HEP in order to improve extension to less than 7 to improve gait pattern Baseline: Goal status: IN PROGRESS  2.  PT strength to be improved one grade to allow pt to walk  with a cane Baseline:  Goal status: IN PROGRESS  3.  PT pain level to be no greater than a 5/10 to allow pt to be up for 45 minutes without having to rest.  Baseline:  Goal status: IN PROGRESS  4.  Pt ROM of Rt knee to be to 90 degrees to allow pt to sit in comfort for 30  minutes.     LONG TERM GOALS: Target date: 11/09/22  Pt to be I in advanced  HEP in order to improve extension to less than 3 for improved gait.  Baseline:  Goal status: IN PROGRESS  2.  PT strength to be improved one grade to allow pt to walk without an assistive device Baseline:  Goal status: IN PROGRESS  3.  PT pain level to be no greater than a 2/10 to allow pt to be up for 90 minutes without having to res Baseline:  Goal status: IN PROGRESS  4.   Pt ROM of Rt knee to be to 115 degrees to allow pt squat to pick items off the floor and play with her grandchildren.  Baseline:  Goal status: IN PROGRESS     PLAN:  PT FREQUENCY: 2x/week  PT DURATION: 8 weeks  PLANNED INTERVENTIONS: Therapeutic exercises, Therapeutic activity, Neuromuscular re-education, Balance training, Gait training, Patient/Family education, Self Care, Joint mobilization, and Manual therapy  PLAN FOR NEXT SESSION: Continue to progress ROM, Strength and decrease edema for pain    Becky Sax, LPTA/CLT; CBIS [phone]  Juel Burrow, PTA 10/16/2022, 5:04 PM  4:59 PM, 10/16/22

## 2022-10-18 ENCOUNTER — Encounter (HOSPITAL_COMMUNITY): Payer: 59

## 2022-10-23 ENCOUNTER — Ambulatory Visit (HOSPITAL_COMMUNITY): Payer: 59 | Admitting: Physical Therapy

## 2022-10-23 DIAGNOSIS — M25561 Pain in right knee: Secondary | ICD-10-CM | POA: Diagnosis not present

## 2022-10-23 DIAGNOSIS — M25661 Stiffness of right knee, not elsewhere classified: Secondary | ICD-10-CM

## 2022-10-23 DIAGNOSIS — R600 Localized edema: Secondary | ICD-10-CM

## 2022-10-23 DIAGNOSIS — M6281 Muscle weakness (generalized): Secondary | ICD-10-CM | POA: Diagnosis not present

## 2022-10-23 NOTE — Therapy (Signed)
OUTPATIENT PHYSICAL THERAPY LOWER EXTREMITY Treatment   Patient Name: Margaret Gonzalez MRN: [id number] DOB:[birth year], 62 y.o., female Today's Date: 10/23/2022  END OF SESSION: END OF SESSION:   PT End of Session - 10/23/22 1515    Visit Number 6    Number of Visits 21    Date for PT Re-Evaluation 11/23/22    Authorization Type [place]    PT Start Time 1433    PT Stop Time 1515    PT Time Calculation (min) 42 min            Past Medical History:  Diagnosis Date   Arthritis    Depression    Headache(784.0)    Hypertension    Pre-diabetes    Past Surgical History:  Procedure Laterality Date   CESAREAN SECTION  [id number]   x3   CHOLECYSTECTOMY  1986   CLOSED REDUCTION HUMERUS FRACTURE Left 07/01/2020   Procedure: CLOSED REDUCTION HUMERAL SHAFT;  Surgeon: Vickki Hearing, MD;  Location: [facility];  Service: Orthopedics;  Laterality: Left;   COLONOSCOPY     KNEE ARTHROSCOPY Left 11/12/2012   Procedure: ARTHROSCOPY KNEE ;  Surgeon: Harvie Junior, MD;  Location: [facility];  Service: Orthopedics;  Laterality: Left;  partial medial and lateral menisectomies and chrondroplasty of patella   KNEE ARTHROSCOPY Right    ORIF HUMERUS FRACTURE Left 07/26/2020   Procedure: OPEN REDUCTION INTERNAL FIXATION (ORIF) HUMERAL SHAFT FRACTURE WITH AUTOGRAFT;  Surgeon: Vickki Hearing, MD;  Location: [facility];  Service: Orthopedics;  Laterality: Left;   Patient Active Problem List   Diagnosis Date Noted   Adjustment disorder with mixed emotional features 07/20/2020   Anxiety 07/20/2020   Body mass index (BMI) 39.0-39.9, adult 07/20/2020   Fatty liver 07/20/2020   Hyperlipidemia 07/20/2020   Hypertension 07/20/2020   Iron deficiency anemia 07/20/2020   Recurrent major depression (HCC) 07/20/2020   Type 2 diabetes mellitus with other specified complication (HCC) 07/20/2020   Closed displaced transverse fracture of shaft of left humerus 06/27/2020    PCP: Tally Joe  REFERRING PROVIDER: Jodi Geralds, MD  REFERRING DIAG: s/p r total knee replacment SURGERY ON 09/19/2022 THERAPY NEEDS TO START 09/21/22  THERAPY DIAG:  Right knee stiffness Edema Decreased strength Rt knee pain   Rationale for Evaluation and Treatment: Rehabilitation  ONSET DATE: 09/19/22  SUBJECTIVE STATEMENT:   Pt states that she has been walking about a mile  PERTINENT HISTORY: OA PAIN:  Are you having pain? Yes: NPRS scale: 2 /10 Pain location: Rt knee Pain description: aching and burning  Aggravating factors: activity  Relieving factors: elevate.   PRECAUTIONS: None  WEIGHT BEARING RESTRICTIONS: No  FALLS:  Has patient fallen in last 6 months? No  LIVING ENVIRONMENT: Lives with: lives with their family Lives in: House/apartment Stairs: Yes: External: 4 steps does not need to go up/ 8 steps going upstairs to her bedroom  Has following equipment at home: Dan Humphreys - 2 wheeled  OCCUPATION: nurse   PLOF: Independent  PATIENT GOALS: To walk without pain, be able to play with her grandchildren    NEXT MD VISIT: 10/30/22; RTW 10/31/22  OBJECTIVE:     PATIENT SURVEYS:  FOTO 15  COGNITION: Overall cognitive status: Within functional limits for tasks assessed     SENSATION: WFL   PALPATION: Noted increased edema   LOWER EXTREMITY ROM:  Active ROM Right eval 09/25/22 Left eval Right 10/04/22 Right 10/11/22 Right 10/16/22 Right 10/23/22  Hip flexion  Hip extension         Hip abduction         Hip adduction         Hip internal rotation         Hip external rotation           Knee extension 2   Ankle dorsiflexion    Ankle plantarflexion    Ankle inversion    Ankle eversion     (Blank rows = not tested)    FUNCTIONAL TESTS:  2 minute walk test: 60 ft with walker walk to gait pattern.     TODAY'S TREATMENT:                                                                                                                               DATE:  10/21/22 Stairs x 3 reps Vector stance 5" x 5 B Slant board stretch 1 minute x 2 Rockerboard x 2 minutes Knee flexion x 10  Squat to pick up cone on 6" step to  Heel raise as if putting it on a shelf x 10  Bike x 5 Supine: Quad set x 10 Heel slide  Manual for scar massage.  10/16/22:   Bike for mobility for flexion Supine: Manual retrograde massage Patella mobs STM to ITB and lateral knee Bridge with ball squeeze 10x 5" AROM 6-118 degrees  Prone: TKE 10x 5" Prone knee hang x 2 min Standing: Squat Knee drive on 27OZ step 5x 10" Hamstring stretch 3x 30" on 12in step Gastroc stretch 2x 30"   10/11/22 Knee drives for flexion x 2' on 12" box  Heel/toe raises x 20 Slant board 5 x 20" Bike seat 12 x 5' full forward revolutions for mobility Functional squat 2 x 10 TKE's green theraband x 20 Hip abduction x 15 IT band stretch in standing x 20" AROM right knee in supine -6 to 115  10/04/22 Bike seat 12 full revolution initially backwards then forward Heel raise incline slope 15x Toe raise decline slope 15x TKE GTB 10 x 5" Knee drive 36UY step height 5x 10" holds Slant board 3x 30" Functional Squat 10x Supine: manual to decrease pain and edema  Quad sets 10x Heel slide 10x AROM 7-113  09/25/22 Standing : Heel raise x 10 Functional squat x 10  Sitting: Long arc quad x 10  Supine: Quad set x 10 SAQ x 10  Heel slide x 5 SLR x 5  2 sets   Bridge 5 x 2 sets  Side lying hip abduction x 10  Manual to decrease pain and edema  09/21/22 Eval  - Supine Ankle Pumps x 10 - Supine Quadricep Sets  10 reps - 3" hold - Supine Gluteal Sets  10x - 5" hold - Supine Heel Slide  -  10 reps - 5" hold - Supine March  - 2 needs assist and very painful  Manual to decrease edema   PATIENT  EDUCATION:  Education details: HEP self manual to decrease swelling Person educated: Patient Education method: [employer] Education comprehension: needs further  education  HOME EXERCISE PROGRAM: Access Code: [email or web address] URL: [email or web address] Date: 09/21/2022 Prepared by: Virgina Organ 09/25/22 Supine Active Straight Leg Raise  - 2 x daily - 7 x weekly - 1 sets - 10 reps - 3" hold - Supine Bridge  - 2 x daily - 7 x weekly - 1 sets - 10 reps - 3" hold - Sidelying Hip Abduction  - 2 x daily - 7 x weekly - 1 sets - 10 reps - 3" hold Exercises - Supine Ankle Pumps  - 2 x daily - 7 x weekly - 1 sets - 10 reps - 3" hold - Supine Quadricep Sets  - 2 x daily - 7 x weekly - 1 sets - 10 reps - 3" hold - Supine Gluteal Sets  - 2 x daily - 7 x weekly - 1 sets - 10 reps - 5" hold - Supine Heel Slide  - 2 x daily - 7 x weekly - 1 sets - 10 reps - 5" hold - Supine March  - 1 x daily - 7 x weekly - 3 sets - 10 reps  - Prone Knee Extension Hang  - 1 x daily - 7 x weekly - 3 sets - 2 reps - 2', increase as able hold - Standing ITB Stretch  - 1 x daily - 7 x weekly - 3 sets - 3 reps - 30" hold - Standing Gastroc Stretch at Counter  - 1 x daily - 7 x weekly - 3 sets - 3 reps - 30" hold  ASSESSMENT:  CLINICAL IMPRESSION: Pt ROM for extension has decreased somewhat encouraged pt to continue with quad sets as she has stopped completing these at home.  Pt able to complete stair climbing with hand rail assist in a reciprocal manner.  Manual demonstrates increased scar tissue and edema therefore manual will be completed to decrease these issues.  Pt overall doing remarkable well.      OBJECTIVE IMPAIRMENTS: decreased balance, difficulty walking, decreased ROM, and decreased strength.   ACTIVITY LIMITATIONS: carrying, lifting, stairs, and locomotion level  PARTICIPATION LIMITATIONS: cleaning, laundry, shopping, community activity, and yard work  [facility] POTENTIAL: Good  CLINICAL DECISION MAKING: Stable/uncomplicated  EVALUATION COMPLEXITY: Low   GOALS: Goals reviewed with patient? No  SHORT TERM GOALS: Target date: 10/19/22 Pt to be I in HEP  in order to improve extension to less than 7 to improve gait pattern Baseline: Goal status: IN PROGRESS  2.  PT strength to be improved one grade to allow pt to walk  with a cane Baseline:  Goal status: IN PROGRESS  3.  PT pain level to be no greater than a 5/10 to allow pt to be up for 45 minutes without having to rest.  Baseline:  Goal status: IN PROGRESS  4.  Pt ROM of Rt knee to be to 90 degrees to allow pt to sit in comfort for 30 minutes.     LONG TERM GOALS: Target date: 11/09/22  Pt to be I in advanced  HEP in order to improve extension to less than 3 for improved gait.  Baseline:  Goal status: IN PROGRESS  2.  PT strength to be improved one grade to allow pt to walk without an assistive device Baseline:  Goal status: IN PROGRESS  3.  PT pain level to be no greater than a 2/10 to allow  pt to be up for 90 minutes without having to res Baseline:  Goal status: IN PROGRESS  4.   Pt ROM of Rt knee to be to 115 degrees to allow pt squat to pick items off the floor and play with her grandchildren.  Baseline:  Goal status: IN PROGRESS     PLAN:  PT FREQUENCY: 2x/week  PT DURATION: 8 weeks  PLANNED INTERVENTIONS: Therapeutic exercises, Therapeutic activity, Neuromuscular re-education, Balance training, Gait training, Patient/Family education, Self Care, Joint mobilization, and Manual therapy  PLAN FOR NEXT SESSION: Continue to progress ROM, Strength and decrease edema for pain   Virgina Organ, PT CLT [phone]  10/23/2022, 3:19 PM  3:19 PM, 10/23/22

## 2022-10-25 ENCOUNTER — Encounter (HOSPITAL_COMMUNITY): Payer: 59 | Admitting: Physical Therapy

## 2022-10-30 ENCOUNTER — Ambulatory Visit (HOSPITAL_COMMUNITY): Payer: 59 | Attending: Orthopedic Surgery

## 2022-10-30 DIAGNOSIS — R600 Localized edema: Secondary | ICD-10-CM | POA: Diagnosis not present

## 2022-10-30 DIAGNOSIS — M25661 Stiffness of right knee, not elsewhere classified: Secondary | ICD-10-CM | POA: Insufficient documentation

## 2022-10-30 DIAGNOSIS — M6281 Muscle weakness (generalized): Secondary | ICD-10-CM | POA: Insufficient documentation

## 2022-10-30 DIAGNOSIS — M25561 Pain in right knee: Secondary | ICD-10-CM | POA: Diagnosis not present

## 2022-10-30 NOTE — Therapy (Signed)
OUTPATIENT PHYSICAL THERAPY LOWER EXTREMITY Treatment   Patient Name: Margaret Gonzalez MRN: [id number] DOB:1960-06-28, 62 y.o., female Today's Date: 10/30/2022  END OF SESSION: END OF SESSION:   PT End of Session - 10/23/22 1515    Visit Number 6    Number of Visits 21    Date for PT Re-Evaluation 11/23/22    Authorization Type [place]    PT Start Time 1433    PT Stop Time 1515    PT Time Calculation (min) 42 min            Past Medical History:  Diagnosis Date   Arthritis    Depression    Headache(784.0)    Hypertension    Pre-diabetes    Past Surgical History:  Procedure Laterality Date   CESAREAN SECTION  [phone]   x3   CHOLECYSTECTOMY  1986   CLOSED REDUCTION HUMERUS FRACTURE Left 07/01/2020   Procedure: CLOSED REDUCTION HUMERAL SHAFT;  Surgeon: Vickki Hearing, MD;  Location: [facility];  Service: Orthopedics;  Laterality: Left;   COLONOSCOPY     KNEE ARTHROSCOPY Left 11/12/2012   Procedure: ARTHROSCOPY KNEE ;  Surgeon: Harvie Junior, MD;  [facility];  Service: Orthopedics;  Laterality: Left;  partial medial and lateral menisectomies and chrondroplasty of patella   KNEE ARTHROSCOPY Right    ORIF HUMERUS FRACTURE Left 07/26/2020   Procedure: OPEN REDUCTION INTERNAL FIXATION (ORIF) HUMERAL SHAFT FRACTURE WITH AUTOGRAFT;  Surgeon: Vickki Hearing, MD;  Location: [facility];  Service: Orthopedics;  Laterality: Left;   Patient Active Problem List   Diagnosis Date Noted   Adjustment disorder with mixed emotional features 07/20/2020   Anxiety 07/20/2020   Body mass index (BMI) 39.0-39.9, adult 07/20/2020   Fatty liver 07/20/2020   Hyperlipidemia 07/20/2020   Hypertension 07/20/2020   Iron deficiency anemia 07/20/2020   Recurrent major depression (HCC) 07/20/2020   Type 2 diabetes mellitus with other specified complication (HCC) 07/20/2020   Closed displaced transverse fracture of shaft of left humerus 06/27/2020    PCP: Tally Joe  REFERRING PROVIDER: Jodi Geralds, MD  REFERRING DIAG: s/p r total knee replacment SURGERY ON 09/19/2022 THERAPY NEEDS TO START 09/21/22  THERAPY DIAG:  Right knee stiffness Edema Decreased strength Rt knee pain   Rationale for Evaluation and Treatment: Rehabilitation  ONSET DATE: 09/19/22  SUBJECTIVE STATEMENT:   Pain is slowly decreasing; still having the most discomfort at night  PERTINENT HISTORY: OA PAIN:  Are you having pain? Yes: NPRS scale: 2 /10 Pain location: Rt knee Pain description: aching and burning  Aggravating factors: activity  Relieving factors: elevate.   PRECAUTIONS: None  WEIGHT BEARING RESTRICTIONS: No  FALLS:  Has patient fallen in last 6 months? No  LIVING ENVIRONMENT: Lives with: lives with their family Lives in: House/apartment Stairs: Yes: External: 4 steps does not need to go up/ 8 steps going upstairs to her bedroom  Has following equipment at home: Dan Humphreys - 2 wheeled  OCCUPATION: nurse   PLOF: Independent  PATIENT GOALS: To walk without pain, be able to play with her grandchildren    NEXT MD VISIT:  11/12/22  OBJECTIVE:     PATIENT SURVEYS:  FOTO 15  COGNITION: Overall cognitive status: Within functional limits for tasks assessed     SENSATION: WFL   PALPATION: Noted increased edema   LOWER EXTREMITY ROM:  Active ROM Right eval 09/25/22 Left eval Right 10/04/22 Right 10/11/22 Right 10/16/22 Right 10/23/22  Hip flexion  Hip extension         Hip abduction         Hip adduction         Hip internal rotation         Hip external rotation           Knee extension 2   Ankle dorsiflexion    Ankle plantarflexion    Ankle inversion    Ankle eversion     (Blank rows = not tested)    FUNCTIONAL TESTS:  2 minute walk test: 60 ft with walker walk to gait pattern.     TODAY'S TREATMENT:                                                                                                                               DATE:  10/30/22  Bike seat 12 x 5'  Standing: Heel/toe raises on incline 2 x 10 Squats 2 x 10 Knee drives for flexion on 12" box x 2' Slant board 5 x 20"  TKE Bodycraft 3 plates x 20 Leg press bodycraft 4 plates 2 x 10  Seated hamstring stretch 5 x 20"  10/21/22 Stairs x 3 reps Vector stance 5" x 5 B Slant board stretch 1 minute x 2 Rockerboard x 2 minutes Knee flexion x 10  Squat to pick up cone on 6" step to  Heel raise as if putting it on a shelf x 10  Bike x 5 Supine: Quad set x 10 Heel slide  Manual for scar massage.  10/16/22:   Bike for mobility for flexion Supine: Manual retrograde massage Patella mobs STM to ITB and lateral knee Bridge with ball squeeze 10x 5" AROM 6-118 degrees  Prone: TKE 10x 5" Prone knee hang x 2 min Standing: Squat Knee drive on 84XL step 5x 10" Hamstring stretch 3x 30" on 12in step Gastroc stretch 2x 30"   10/11/22 Knee drives for flexion x 2' on 12" box  Heel/toe raises x 20 Slant board 5 x 20" Bike seat 12 x 5' full forward revolutions for mobility Functional squat 2 x 10 TKE's green theraband x 20 Hip abduction x 15 IT band stretch in standing x 20" AROM right knee in supine -6 to 115  10/04/22 Bike seat 12 full revolution initially backwards then forward Heel raise incline slope 15x Toe raise decline slope 15x TKE GTB 10 x 5" Knee drive 24MW step height 5x 10" holds Slant board 3x 30" Functional Squat 10x Supine: manual to decrease pain and edema  Quad sets 10x Heel slide 10x AROM 7-113  09/25/22 Standing : Heel raise x 10 Functional squat x 10  Sitting: Long arc quad x 10  Supine: Quad set x 10 SAQ x 10  Heel slide x 5 SLR x 5  2 sets   Bridge 5 x 2 sets  Side lying hip abduction x 10  Manual to decrease pain and edema  09/21/22 Eval  -  Supine Ankle Pumps x 10 - Supine Quadricep Sets  10 reps - 3" hold - Supine Gluteal Sets  10x - 5" hold - Supine Heel Slide  -  10 reps - 5" hold -  Supine March  - 2 needs assist and very painful  Manual to decrease edema   PATIENT EDUCATION:  Education details: HEP self manual to decrease swelling Person educated: Patient Education method: [employer] Education comprehension: needs further education  HOME EXERCISE PROGRAM: Access Code: [email or web address] URL: [email or web address] Date: 09/21/2022 Prepared by: Virgina Organ 09/25/22 Supine Active Straight Leg Raise  - 2 x daily - 7 x weekly - 1 sets - 10 reps - 3" hold - Supine Bridge  - 2 x daily - 7 x weekly - 1 sets - 10 reps - 3" hold - Sidelying Hip Abduction  - 2 x daily - 7 x weekly - 1 sets - 10 reps - 3" hold Exercises - Supine Ankle Pumps  - 2 x daily - 7 x weekly - 1 sets - 10 reps - 3" hold - Supine Quadricep Sets  - 2 x daily - 7 x weekly - 1 sets - 10 reps - 3" hold - Supine Gluteal Sets  - 2 x daily - 7 x weekly - 1 sets - 10 reps - 5" hold - Supine Heel Slide  - 2 x daily - 7 x weekly - 1 sets - 10 reps - 5" hold - Supine March  - 1 x daily - 7 x weekly - 3 sets - 10 reps  - Prone Knee Extension Hang  - 1 x daily - 7 x weekly - 3 sets - 2 reps - 2', increase as able hold - Standing ITB Stretch  - 1 x daily - 7 x weekly - 3 sets - 3 reps - 30" hold - Standing Gastroc Stretch at Counter  - 1 x daily - 7 x weekly - 3 sets - 3 reps - 30" hold  ASSESSMENT:  CLINICAL IMPRESSION: Today's session continued to focus on right knee mobility and strength.    Added body craft exercises today for strengthening with good challenge.  No increased pain with treatment noted. Overall progressing very well.   Patient will benefit from continued skilled therapy services  to address deficits and promote return to optimal function.       OBJECTIVE IMPAIRMENTS: decreased balance, difficulty walking, decreased ROM, and decreased strength.   ACTIVITY LIMITATIONS: carrying, lifting, stairs, and locomotion level  PARTICIPATION LIMITATIONS: cleaning, laundry,  shopping, community activity, and yard work  [facility] POTENTIAL: Good  CLINICAL DECISION MAKING: Stable/uncomplicated  EVALUATION COMPLEXITY: Low   GOALS: Goals reviewed with patient? No  SHORT TERM GOALS: Target date: 10/19/22 Pt to be I in HEP in order to improve extension to less than 7 to improve gait pattern Baseline: Goal status: IN PROGRESS  2.  PT strength to be improved one grade to allow pt to walk  with a cane Baseline:  Goal status: IN PROGRESS  3.  PT pain level to be no greater than a 5/10 to allow pt to be up for 45 minutes without having to rest.  Baseline:  Goal status: IN PROGRESS  4.  Pt ROM of Rt knee to be to 90 degrees to allow pt to sit in comfort for 30 minutes.     LONG TERM GOALS: Target date: 11/09/22  Pt to be I in advanced  HEP in order to improve extension to less than  3 for improved gait.  Baseline:  Goal status: IN PROGRESS  2.  PT strength to be improved one grade to allow pt to walk without an assistive device Baseline:  Goal status: IN PROGRESS  3.  PT pain level to be no greater than a 2/10 to allow pt to be up for 90 minutes without having to res Baseline:  Goal status: IN PROGRESS  4.   Pt ROM of Rt knee to be to 115 degrees to allow pt squat to pick items off the floor and play with her grandchildren.  Baseline:  Goal status: IN PROGRESS     PLAN:  PT FREQUENCY: 2x/week  PT DURATION: 8 weeks  PLANNED INTERVENTIONS: Therapeutic exercises, Therapeutic activity, Neuromuscular re-education, Balance training, Gait training, Patient/Family education, Self Care, Joint mobilization, and Manual therapy  PLAN FOR NEXT SESSION: Continue to progress ROM, Strength and decrease edema for pain   3:14 PM, 10/30/22 Enora Trillo Small Matei Magnone MPT [facility] [place] [phone] Ph:[phone]

## 2022-11-02 ENCOUNTER — Ambulatory Visit (HOSPITAL_COMMUNITY): Payer: 59

## 2022-11-02 DIAGNOSIS — M25661 Stiffness of right knee, not elsewhere classified: Secondary | ICD-10-CM | POA: Diagnosis not present

## 2022-11-02 DIAGNOSIS — R600 Localized edema: Secondary | ICD-10-CM | POA: Diagnosis not present

## 2022-11-02 DIAGNOSIS — M25561 Pain in right knee: Secondary | ICD-10-CM | POA: Diagnosis not present

## 2022-11-02 DIAGNOSIS — M6281 Muscle weakness (generalized): Secondary | ICD-10-CM | POA: Diagnosis not present

## 2022-11-02 NOTE — Therapy (Signed)
OUTPATIENT PHYSICAL THERAPY LOWER EXTREMITY Treatment   Patient Name: Margaret Gonzalez MRN: [id number] DOB:[birth year], 62 y.o., female Today's Date: 11/02/2022  END OF SESSION: END OF SESSION:   PT End of Session - 11/02/22 1431     Visit Number 8    Number of Visits 21    Date for PT Re-Evaluation 11/23/22    Authorization Type [place]    PT Start Time 0231    PT Stop Time 0309    PT Time Calculation (min) 38 min                 Past Medical History:  Diagnosis Date   Arthritis    Depression    Headache(784.0)    Hypertension    Pre-diabetes    Past Surgical History:  Procedure Laterality Date   CESAREAN SECTION  [phone]   x3   CHOLECYSTECTOMY  1986   CLOSED REDUCTION HUMERUS FRACTURE Left 07/01/2020   Procedure: CLOSED REDUCTION HUMERAL SHAFT;  Surgeon: Vickki Hearing, MD;  Location: [facility];  Service: Orthopedics;  Laterality: Left;   COLONOSCOPY     KNEE ARTHROSCOPY Left 11/12/2012   Procedure: ARTHROSCOPY KNEE ;  Surgeon: Harvie Junior, MD;  Location: [facility];  Service: Orthopedics;  Laterality: Left;  partial medial and lateral menisectomies and chrondroplasty of patella   KNEE ARTHROSCOPY Right    ORIF HUMERUS FRACTURE Left 07/26/2020   Procedure: OPEN REDUCTION INTERNAL FIXATION (ORIF) HUMERAL SHAFT FRACTURE WITH AUTOGRAFT;  Surgeon: Vickki Hearing, MD;  Location: [facility];  Service: Orthopedics;  Laterality: Left;   Patient Active Problem List   Diagnosis Date Noted   Adjustment disorder with mixed emotional features 07/20/2020   Anxiety 07/20/2020   Body mass index (BMI) 39.0-39.9, adult 07/20/2020   Fatty liver 07/20/2020   Hyperlipidemia 07/20/2020   Hypertension 07/20/2020   Iron deficiency anemia 07/20/2020   Recurrent major depression (HCC) 07/20/2020   Type 2 diabetes mellitus with other specified complication (HCC) 07/20/2020   Closed displaced transverse fracture of shaft of left humerus 06/27/2020    PCP: Tally Joe  REFERRING PROVIDER: Jodi Geralds, MD  REFERRING DIAG: s/p r total knee replacment SURGERY ON 09/19/2022 THERAPY NEEDS TO START 09/21/22  THERAPY DIAG:  Right knee stiffness Edema Decreased strength Rt knee pain   Rationale for Evaluation and Treatment: Rehabilitation  ONSET DATE: 09/19/22  SUBJECTIVE STATEMENT:   Having some stomach issues; right knee was sore after last visit.    PERTINENT HISTORY: OA PAIN:  Are you having pain? Yes: NPRS scale: 2 /10 Pain location: Rt knee Pain description: aching and burning  Aggravating factors: activity  Relieving factors: elevate.   PRECAUTIONS: None  WEIGHT BEARING RESTRICTIONS: No  FALLS:  Has patient fallen in last 6 months? No  LIVING ENVIRONMENT: Lives with: lives with their family Lives in: House/apartment Stairs: Yes: External: 4 steps does not need to go up/ 8 steps going upstairs to her bedroom  Has following equipment at home: Dan Humphreys - 2 wheeled  OCCUPATION: nurse   PLOF: Independent  PATIENT GOALS: To walk without pain, be able to play with her grandchildren    NEXT MD VISIT:  11/12/22  OBJECTIVE:     PATIENT SURVEYS:  FOTO 15  COGNITION: Overall cognitive status: Within functional limits for tasks assessed     SENSATION: WFL   PALPATION: Noted increased edema   LOWER EXTREMITY ROM:  Active ROM Right eval 09/25/22 Left eval Right 10/04/22 Right 10/11/22  Right 10/16/22 Right 10/23/22 Right 11/02/22  Hip flexion          Hip extension          Hip abduction          Hip adduction          Hip internal rotation          Hip external rotation           knee flexion        127  Knee extension        -5 (equal to left)   MMT's  Right eval left Right 11/02/22  Knee extension 2  5  Knee flexion   4+  Ankle dorsiflexion     Ankle plantarflexion     Ankle inversion     Ankle eversion      (Blank rows = not tested)    FUNCTIONAL TESTS:  2 minute walk test: 60 ft with walker walk to  gait pattern.     TODAY'S TREATMENT:                                                                                                                              DATE:  11/02/22 Bike seat 11 x 5'  Standing: Heel/toe raises 2 x 10 Squats 2 x 10  Bodycraft  3 plates TKE x 30 Leg press 4 plates 3 x 10  AROM right knee -5 to 127 Supine: SLR 2 x 10 MMT's see above   10/30/22  Bike seat 12 x 5'  Standing: Heel/toe raises on incline 2 x 10 Squats 2 x 10 Knee drives for flexion on 12" box x 2' Slant board 5 x 20"  TKE Bodycraft 3 plates x 20 Leg press bodycraft 4 plates 2 x 10  Seated hamstring stretch 5 x 20"  10/21/22 Stairs x 3 reps Vector stance 5" x 5 B Slant board stretch 1 minute x 2 Rockerboard x 2 minutes Knee flexion x 10  Squat to pick up cone on 6" step to  Heel raise as if putting it on a shelf x 10  Bike x 5 Supine: Quad set x 10 Heel slide  Manual for scar massage.  10/16/22:   Bike for mobility for flexion Supine: Manual retrograde massage Patella mobs STM to ITB and lateral knee Bridge with ball squeeze 10x 5" AROM 6-118 degrees  Prone: TKE 10x 5" Prone knee hang x 2 min Standing: Squat Knee drive on 95MW step 5x 10" Hamstring stretch 3x 30" on 12in step Gastroc stretch 2x 30"   10/11/22 Knee drives for flexion x 2' on 12" box  Heel/toe raises x 20 Slant board 5 x 20" Bike seat 12 x 5' full forward revolutions for mobility Functional squat 2 x 10 TKE's green theraband x 20 Hip abduction x 15 IT band stretch in standing x 20" AROM right knee in supine -6 to 115  10/04/22 Bike seat  12 full revolution initially backwards then forward Heel raise incline slope 15x Toe raise decline slope 15x TKE GTB 10 x 5" Knee drive 16XW step height 5x 10" holds Slant board 3x 30" Functional Squat 10x Supine: manual to decrease pain and edema  Quad sets 10x Heel slide 10x AROM 7-113  09/25/22 Standing : Heel raise x 10 Functional squat x 10   Sitting: Long arc quad x 10  Supine: Quad set x 10 SAQ x 10  Heel slide x 5 SLR x 5  2 sets   Bridge 5 x 2 sets  Side lying hip abduction x 10  Manual to decrease pain and edema  09/21/22 Eval  - Supine Ankle Pumps x 10 - Supine Quadricep Sets  10 reps - 3" hold - Supine Gluteal Sets  10x - 5" hold - Supine Heel Slide  -  10 reps - 5" hold - Supine March  - 2 needs assist and very painful  Manual to decrease edema   PATIENT EDUCATION:  Education details: HEP self manual to decrease swelling Person educated: Patient Education method: [employer] Education comprehension: needs further education  HOME EXERCISE PROGRAM: Access Code: [email or web address] URL: [email or web address] Date: 09/21/2022 Prepared by: Virgina Organ 09/25/22 Supine Active Straight Leg Raise  - 2 x daily - 7 x weekly - 1 sets - 10 reps - 3" hold - Supine Bridge  - 2 x daily - 7 x weekly - 1 sets - 10 reps - 3" hold - Sidelying Hip Abduction  - 2 x daily - 7 x weekly - 1 sets - 10 reps - 3" hold Exercises - Supine Ankle Pumps  - 2 x daily - 7 x weekly - 1 sets - 10 reps - 3" hold - Supine Quadricep Sets  - 2 x daily - 7 x weekly - 1 sets - 10 reps - 3" hold - Supine Gluteal Sets  - 2 x daily - 7 x weekly - 1 sets - 10 reps - 5" hold - Supine Heel Slide  - 2 x daily - 7 x weekly - 1 sets - 10 reps - 5" hold - Supine March  - 1 x daily - 7 x weekly - 3 sets - 10 reps  - Prone Knee Extension Hang  - 1 x daily - 7 x weekly - 3 sets - 2 reps - 2', increase as able hold - Standing ITB Stretch  - 1 x daily - 7 x weekly - 3 sets - 3 reps - 30" hold - Standing Gastroc Stretch at Counter  - 1 x daily - 7 x weekly - 3 sets - 3 reps - 30" hold  ASSESSMENT:  CLINICAL IMPRESSION: Today's session continued to focus on right knee mobility and strength.  Increased reps of PRE's leg press and TKE's with good challenge.  Good knee flexion rom, right  knee extension equal to left.   Good strength noted.  Patient has met goals for both strength and mobility.    Patient will benefit from continued skilled therapy services  to address deficits and promote return to optimal function.       OBJECTIVE IMPAIRMENTS: decreased balance, difficulty walking, decreased ROM, and decreased strength.   ACTIVITY LIMITATIONS: carrying, lifting, stairs, and locomotion level  PARTICIPATION LIMITATIONS: cleaning, laundry, shopping, community activity, and yard work  [facility] POTENTIAL: Good  CLINICAL DECISION MAKING: Stable/uncomplicated  EVALUATION COMPLEXITY: Low   GOALS: Goals reviewed with patient? No  SHORT TERM GOALS: Target date:  10/19/22 Pt to be I in HEP in order to improve extension to less than 7 to improve gait pattern Baseline: Goal status: IN PROGRESS  2.  PT strength to be improved one grade to allow pt to walk  with a cane Baseline:  Goal status: IN PROGRESS  3.  PT pain level to be no greater than a 5/10 to allow pt to be up for 45 minutes without having to rest.  Baseline:  Goal status: IN PROGRESS  4.  Pt ROM of Rt knee to be to 90 degrees to allow pt to sit in comfort for 30 minutes.     LONG TERM GOALS: Target date: 11/09/22  Pt to be I in advanced  HEP in order to improve extension to less than 3 for improved gait.  Baseline:  Goal status: IN PROGRESS  2.  PT strength to be improved one grade to allow pt to walk without an assistive device Baseline:  Goal status: MET  3.  PT pain level to be no greater than a 2/10 to allow pt to be up for 90 minutes without having to res Baseline:  Goal status: IN PROGRESS  4.   Pt ROM of Rt knee to be to 115 degrees to allow pt squat to pick items off the floor and play with her grandchildren.  Baseline:  Goal status: MET     PLAN:  PT FREQUENCY: 2x/week  PT DURATION: 8 weeks  PLANNED INTERVENTIONS: Therapeutic exercises, Therapeutic activity, Neuromuscular re-education, Balance training, Gait training, Patient/Family  education, Self Care, Joint mobilization, and Manual therapy  PLAN FOR NEXT SESSION: Continue to progress ROM, Strength and decrease edema for pain ; sees MD 7/15 consider possible reassess for discharge.    3:12 PM, 11/02/22 Jamiaya Bina Small Marcellus Pulliam MPT [facility] [phone]

## 2022-11-05 ENCOUNTER — Encounter (HOSPITAL_COMMUNITY): Payer: 59

## 2022-11-06 ENCOUNTER — Encounter (HOSPITAL_COMMUNITY): Payer: 59 | Admitting: Physical Therapy

## 2022-11-08 ENCOUNTER — Ambulatory Visit (HOSPITAL_COMMUNITY): Payer: 59 | Admitting: Physical Therapy

## 2022-11-08 DIAGNOSIS — M6281 Muscle weakness (generalized): Secondary | ICD-10-CM

## 2022-11-08 DIAGNOSIS — R600 Localized edema: Secondary | ICD-10-CM | POA: Diagnosis not present

## 2022-11-08 DIAGNOSIS — M25661 Stiffness of right knee, not elsewhere classified: Secondary | ICD-10-CM | POA: Diagnosis not present

## 2022-11-08 DIAGNOSIS — M25561 Pain in right knee: Secondary | ICD-10-CM | POA: Diagnosis not present

## 2022-11-08 NOTE — Therapy (Signed)
OUTPATIENT PHYSICAL THERAPY LOWER EXTREMITY Treatment/progress [facility]     Patient Name: Margaret Gonzalez MRN: [id number] DOB:Jun 21, 1960, 62 y.o., female Today's Date: 11/08/2022  Progress Note/discharge Reporting Period 09/21/22 to 11/08/22  See note below for Objective Data and Assessment of Progress/Goals.   PHYSICAL THERAPY DISCHARGE SUMMARY  Visits from Start of Care: 9  Current functional level related to goals / functional outcomes: See below   Remaining deficits: Pain, slight lack of extension    Education / Equipment: HEP   Patient agrees to discharge. Patient goals were met. Patient is being discharged due to being pleased with the current functional level.    END OF SESSION:   PT End of Session - 11/08/22 1510    Visit Number 9    Number of Visits 21    Date for PT Re-Evaluation 11/23/22    Authorization Type [place]    PT Start Time 1432    PT Stop Time 1510    PT Time Calculation (min) 38 min                 Past Medical History:  Diagnosis Date   Arthritis    Depression    Headache(784.0)    Hypertension    Pre-diabetes    Past Surgical History:  Procedure Laterality Date   CESAREAN SECTION  [phone]   x3   CHOLECYSTECTOMY  1986   CLOSED REDUCTION HUMERUS FRACTURE Left 07/01/2020   Procedure: CLOSED REDUCTION HUMERAL SHAFT;  Surgeon: Vickki Hearing, MD;  Location: [facility];  Service: Orthopedics;  Laterality: Left;   COLONOSCOPY     KNEE ARTHROSCOPY Left 11/12/2012   Procedure: ARTHROSCOPY KNEE ;  Surgeon: Harvie Junior, MD;  Location: [facility];  Service: Orthopedics;  Laterality: Left;  partial medial and lateral menisectomies and chrondroplasty of patella   KNEE ARTHROSCOPY Right    ORIF HUMERUS FRACTURE Left 07/26/2020   Procedure: OPEN REDUCTION INTERNAL FIXATION (ORIF) HUMERAL SHAFT FRACTURE WITH AUTOGRAFT;  Surgeon: Vickki Hearing, MD;  Location: [facility];  Service: Orthopedics;  Laterality: Left;   Patient  Active Problem List   Diagnosis Date Noted   Adjustment disorder with mixed emotional features 07/20/2020   Anxiety 07/20/2020   Body mass index (BMI) 39.0-39.9, adult 07/20/2020   Fatty liver 07/20/2020   Hyperlipidemia 07/20/2020   Hypertension 07/20/2020   Iron deficiency anemia 07/20/2020   Recurrent major depression (HCC) 07/20/2020   Type 2 diabetes mellitus with other specified complication (HCC) 07/20/2020   Closed displaced transverse fracture of shaft of left humerus 06/27/2020    PCP: Tally Joe  REFERRING PROVIDER: Jodi Geralds, MD  REFERRING DIAG: s/p r total knee replacment SURGERY ON 09/19/2022 THERAPY NEEDS TO START 09/21/22  THERAPY DIAG:  Right knee stiffness Edema Decreased strength Rt knee pain   Rationale for Evaluation and Treatment: Rehabilitation  ONSET DATE: 09/19/22  SUBJECTIVE STATEMENT:   Having some stomach issues; right knee was sore after last visit.    PERTINENT HISTORY: OA PAIN:  Are you having pain? Yes: NPRS scale: 2 /10 Pain location: Rt knee Pain description: aching and burning  Aggravating factors: activity  Relieving factors: elevate.   PRECAUTIONS: None  WEIGHT BEARING RESTRICTIONS: No  FALLS:  Has patient fallen in last 6 months? No  LIVING ENVIRONMENT: Lives with: lives with their family Lives in: House/apartment Stairs: Yes: External: 4 steps does not need to go up/ 8 steps going upstairs to her bedroom  Has following equipment at home:  Walker - 2 wheeled  OCCUPATION: nurse   PLOF: Independent  PATIENT GOALS: To walk without pain, be able to play with her grandchildren    NEXT MD VISIT:  11/12/22  OBJECTIVE:     PATIENT SURVEYS:  FOTO 15  COGNITION: Overall cognitive status: Within functional limits for tasks assessed     SENSATION: WFL   PALPATION: Noted increased edema   LOWER EXTREMITY ROM:  Active ROM Right eval 09/25/22 Left eval Right 10/04/22 Right 10/11/22 Right 10/16/22  Right 10/23/22 Right 11/02/22 Right  11/08/22  Hip flexion           Hip extension           Hip abduction           Hip adduction           Hip internal rotation           Hip external rotation            knee flexion        127 130  Knee extension        -5 (equal to left) -3   MMT's  Right eval left Right 11/02/22 Right  11/08/22  Knee extension 2  5 4+  Knee flexion   4+ 5  Ankle dorsiflexion    5  Ankle plantarflexion      Ankle inversion      Ankle eversion       (Blank rows = not tested) Rt hip abduction: 5 RT hip flexion: 5  RT extension: 4    FUNCTIONAL TESTS: 11/08/22             Sit to stand in 30 seconds:  14 average for age and sex for healthy population.              Single leg stance: LT: 30" ( therapist stopped); RT: 30"              2 minute walk test 628 ft             5/224/24 2 minute walk test: 60 ft with walker walk to gait pattern.     TODAY'S TREATMENT:                                                                                                                              DATE: 11/08/22 reassess see above Bike x 5'  Terminal extension x 10 Slant board stretch 30" x 3 Sitting: Stool scoot x 1 RT Sit to stand  11/02/22 Bike seat 11 x 5'  Standing: Heel/toe raises 2 x 10 Squats 2 x 10  Bodycraft  3 plates TKE x 30 Leg press 4 plates 3 x 10  AROM right knee -5 to 127 Supine: SLR 2 x 10 MMT's see above   10/30/22  Bike seat 12 x 5'  Standing: Heel/toe raises on incline 2 x 10 Squats 2 x 10 Knee  drives for flexion on 12" box x 2' Slant board 5 x 20"  TKE Bodycraft 3 plates x 20 Leg press bodycraft 4 plates 2 x 10  Seated hamstring stretch 5 x 20"  10/21/22 Stairs x 3 reps Vector stance 5" x 5 B Slant board stretch 1 minute x 2 Rockerboard x 2 minutes Knee flexion x 10  Squat to pick up cone on 6" step to  Heel raise as if putting it on a shelf x 10  Bike x 5 Supine: Quad set x 10 Heel slide  Manual for scar massage.   10/16/22:   Bike for mobility for flexion Supine: Manual retrograde massage Patella mobs STM to ITB and lateral knee Bridge with ball squeeze 10x 5" AROM 6-118 degrees  Prone: TKE 10x 5" Prone knee hang x 2 min Standing: Squat Knee drive on 16XW step 5x 10" Hamstring stretch 3x 30" on 12in step Gastroc stretch 2x 30"   10/11/22 Knee drives for flexion x 2' on 12" box  Heel/toe raises x 20 Slant board 5 x 20" Bike seat 12 x 5' full forward revolutions for mobility Functional squat 2 x 10 TKE's green theraband x 20 Hip abduction x 15 IT band stretch in standing x 20" AROM right knee in supine -6 to 115  10/04/22 Bike seat 12 full revolution initially backwards then forward Heel raise incline slope 15x Toe raise decline slope 15x TKE GTB 10 x 5" Knee drive 96EA step height 5x 10" holds Slant board 3x 30" Functional Squat 10x Supine: manual to decrease pain and edema  Quad sets 10x Heel slide 10x AROM 7-113  09/25/22 Standing : Heel raise x 10 Functional squat x 10  Sitting: Long arc quad x 10  Supine: Quad set x 10 SAQ x 10  Heel slide x 5 SLR x 5  2 sets   Bridge 5 x 2 sets  Side lying hip abduction x 10  Manual to decrease pain and edema  09/21/22 Eval  - Supine Ankle Pumps x 10 - Supine Quadricep Sets  10 reps - 3" hold - Supine Gluteal Sets  10x - 5" hold - Supine Heel Slide  -  10 reps - 5" hold - Supine March  - 2 needs assist and very painful  Manual to decrease edema   PATIENT EDUCATION:  Education details: HEP self manual to decrease swelling Person educated: Patient Education method: [employer] Education comprehension: needs further education  HOME EXERCISE PROGRAM: Access Code: [email or web address] URL: [email or web address] Date: 09/21/2022 Prepared by: Virgina Organ 09/25/22 Supine Active Straight Leg Raise  - 2 x daily - 7 x weekly - 1 sets - 10 reps - 3" hold - Supine Bridge  - 2 x daily - 7 x weekly - 1 sets - 10  reps - 3" hold - Sidelying Hip Abduction  - 2 x daily - 7 x weekly - 1 sets - 10 reps - 3" hold Exercises - Supine Ankle Pumps  - 2 x daily - 7 x weekly - 1 sets - 10 reps - 3" hold - Supine Quadricep Sets  - 2 x daily - 7 x weekly - 1 sets - 10 reps - 3" hold - Supine Gluteal Sets  - 2 x daily - 7 x weekly - 1 sets - 10 reps - 5" hold - Supine Heel Slide  - 2 x daily - 7 x weekly - 1 sets - 10 reps - 5" hold - Supine March  -  1 x daily - 7 x weekly - 3 sets - 10 reps  - Prone Knee Extension Hang  - 1 x daily - 7 x weekly - 3 sets - 2 reps - 2', increase as able hold - Standing ITB Stretch  - 1 x daily - 7 x weekly - 3 sets - 3 reps - 30" hold - Standing Gastroc Stretch at Counter  - 1 x daily - 7 x weekly - 3 sets - 3 reps - 30" hold  ASSESSMENT:  CLINICAL IMPRESSION: PT reassessed today with all short term goals being met and 3/4 LTG being met.  PT and therapist agree to discharge at this time.   OBJECTIVE IMPAIRMENTS: decreased balance, difficulty walking, decreased ROM, and decreased strength.   ACTIVITY LIMITATIONS: carrying, lifting, stairs, and locomotion level  PARTICIPATION LIMITATIONS: cleaning, laundry, shopping, community activity, and yard work  [facility] POTENTIAL: Good  CLINICAL DECISION MAKING: Stable/uncomplicated  EVALUATION COMPLEXITY: Low   GOALS: Goals reviewed with patient? No  SHORT TERM GOALS: Target date: 10/19/22 Pt to be I in HEP in order to improve extension to less than 7 to improve gait pattern Baseline: Goal status: met  2.  PT strength to be improved one grade to allow pt to walk  with a cane Baseline:  Goal status: MET  3.  PT pain level to be no greater than a 5/10 to allow pt to be up for 45 minutes without having to rest.  Baseline:  Goal status: MET  4.  Pt ROM of Rt knee to be to 90 degrees to allow pt to sit in comfort for 30 minutes.   Met   LONG TERM GOALS: Target date: 11/09/22  Pt to be I in advanced  HEP in order to improve  extension to less than 3 for improved gait.  Baseline:  Goal status: MET  2.  PT strength to be improved one grade to allow pt to walk without an assistive device Baseline:  Goal status: MET  3.  PT pain level to be no greater than a 2/10 to allow pt to be up for 90 minutes without having to rest Baseline:  Goal status: IN PROGRESS  4.   Pt ROM of Rt knee to be to 115 degrees to allow pt squat to pick items off the floor and play with her grandchildren.  Baseline:  Goal status: MET     PLAN:  PT FREQUENCY: 2x/week  PT DURATION: 8 weeks  PLANNED INTERVENTIONS: Therapeutic exercises, Therapeutic activity, Neuromuscular re-education, Balance training, Gait training, Patient/Family education, Self Care, Joint mobilization, and Manual therapy  PLAN FOR NEXT SESSION:    3:14 PM, 11/08/22 Virgina Organ, PT CLT [phone]

## 2022-11-12 ENCOUNTER — Encounter (HOSPITAL_COMMUNITY): Payer: 59

## 2022-11-13 ENCOUNTER — Other Ambulatory Visit: Payer: Self-pay

## 2022-11-13 ENCOUNTER — Encounter (HOSPITAL_COMMUNITY): Payer: 59

## 2022-11-14 ENCOUNTER — Encounter (HOSPITAL_COMMUNITY): Payer: 59

## 2022-11-15 ENCOUNTER — Encounter (HOSPITAL_COMMUNITY): Payer: 59 | Admitting: Physical Therapy

## 2022-11-19 ENCOUNTER — Encounter (HOSPITAL_COMMUNITY): Payer: 59

## 2022-11-20 ENCOUNTER — Other Ambulatory Visit: Payer: Self-pay

## 2022-11-20 ENCOUNTER — Encounter (HOSPITAL_COMMUNITY): Payer: 59

## 2022-11-21 ENCOUNTER — Encounter (HOSPITAL_COMMUNITY): Payer: 59 | Admitting: Physical Therapy

## 2022-11-21 ENCOUNTER — Other Ambulatory Visit: Payer: Self-pay

## 2022-11-22 ENCOUNTER — Encounter (HOSPITAL_COMMUNITY): Payer: 59 | Admitting: Physical Therapy

## 2022-11-26 ENCOUNTER — Encounter (HOSPITAL_COMMUNITY): Payer: 59 | Admitting: Physical Therapy

## 2022-11-27 ENCOUNTER — Encounter (HOSPITAL_COMMUNITY): Payer: 59

## 2022-11-28 ENCOUNTER — Encounter (HOSPITAL_COMMUNITY): Payer: 59 | Admitting: Physical Therapy

## 2022-11-29 ENCOUNTER — Encounter (HOSPITAL_COMMUNITY): Payer: 59 | Admitting: Physical Therapy

## 2023-02-12 ENCOUNTER — Other Ambulatory Visit: Payer: Self-pay

## 2023-02-12 MED ORDER — AMOXICILLIN 500 MG PO CAPS
2000.0000 mg | ORAL_CAPSULE | ORAL | 0 refills | Status: DC
  Filled 2023-02-12: qty 20, 5d supply, fill #0

## 2023-02-14 ENCOUNTER — Other Ambulatory Visit: Payer: Self-pay

## 2023-02-14 DIAGNOSIS — M25561 Pain in right knee: Secondary | ICD-10-CM | POA: Diagnosis not present

## 2023-02-14 MED ORDER — MELOXICAM 15 MG PO TABS
15.0000 mg | ORAL_TABLET | Freq: Every day | ORAL | 1 refills | Status: DC
  Filled 2023-02-14: qty 30, 30d supply, fill #0
  Filled 2023-05-13: qty 30, 30d supply, fill #1

## 2023-02-15 ENCOUNTER — Other Ambulatory Visit: Payer: Self-pay

## 2023-02-26 ENCOUNTER — Other Ambulatory Visit: Payer: Self-pay

## 2023-02-26 DIAGNOSIS — I10 Essential (primary) hypertension: Secondary | ICD-10-CM | POA: Diagnosis not present

## 2023-02-26 DIAGNOSIS — E1169 Type 2 diabetes mellitus with other specified complication: Secondary | ICD-10-CM | POA: Diagnosis not present

## 2023-02-26 DIAGNOSIS — D509 Iron deficiency anemia, unspecified: Secondary | ICD-10-CM | POA: Diagnosis not present

## 2023-02-26 DIAGNOSIS — Z5320 Procedure and treatment not carried out because of patient's decision for unspecified reasons: Secondary | ICD-10-CM | POA: Diagnosis not present

## 2023-02-26 DIAGNOSIS — F4323 Adjustment disorder with mixed anxiety and depressed mood: Secondary | ICD-10-CM | POA: Diagnosis not present

## 2023-02-26 DIAGNOSIS — Z1211 Encounter for screening for malignant neoplasm of colon: Secondary | ICD-10-CM | POA: Diagnosis not present

## 2023-02-26 DIAGNOSIS — E782 Mixed hyperlipidemia: Secondary | ICD-10-CM | POA: Diagnosis not present

## 2023-02-26 DIAGNOSIS — Z0000 Encounter for general adult medical examination without abnormal findings: Secondary | ICD-10-CM | POA: Diagnosis not present

## 2023-02-26 MED ORDER — AMLODIPINE BESYLATE 10 MG PO TABS
10.0000 mg | ORAL_TABLET | Freq: Every day | ORAL | 1 refills | Status: AC
  Filled 2023-02-26: qty 90, 90d supply, fill #0
  Filled 2023-07-27: qty 90, 90d supply, fill #1

## 2023-02-26 MED ORDER — LOSARTAN POTASSIUM-HCTZ 100-25 MG PO TABS
1.0000 | ORAL_TABLET | Freq: Every day | ORAL | 1 refills | Status: DC
  Filled 2023-02-26: qty 90, 90d supply, fill #0
  Filled 2023-07-27: qty 90, 90d supply, fill #1

## 2023-02-26 MED ORDER — CITALOPRAM HYDROBROMIDE 20 MG PO TABS
20.0000 mg | ORAL_TABLET | Freq: Every day | ORAL | 1 refills | Status: DC
  Filled 2023-02-26: qty 90, 90d supply, fill #0
  Filled 2023-07-27: qty 90, 90d supply, fill #1

## 2023-02-28 ENCOUNTER — Other Ambulatory Visit: Payer: Self-pay

## 2023-02-28 MED ORDER — MOUNJARO 2.5 MG/0.5ML ~~LOC~~ SOAJ
2.5000 mg | SUBCUTANEOUS | 0 refills | Status: DC
  Filled 2023-02-28: qty 2, 28d supply, fill #0

## 2023-03-01 ENCOUNTER — Other Ambulatory Visit: Payer: Self-pay

## 2023-03-29 ENCOUNTER — Other Ambulatory Visit: Payer: Self-pay

## 2023-03-30 ENCOUNTER — Other Ambulatory Visit: Payer: Self-pay

## 2023-03-30 MED ORDER — MOUNJARO 2.5 MG/0.5ML ~~LOC~~ SOAJ
2.5000 mg | SUBCUTANEOUS | 0 refills | Status: DC
  Filled 2023-03-30: qty 2, 28d supply, fill #0

## 2023-04-01 ENCOUNTER — Other Ambulatory Visit: Payer: Self-pay

## 2023-04-19 ENCOUNTER — Other Ambulatory Visit: Payer: Self-pay

## 2023-04-19 MED ORDER — MOUNJARO 2.5 MG/0.5ML ~~LOC~~ SOAJ
2.5000 mg | SUBCUTANEOUS | 1 refills | Status: AC
  Filled 2023-04-19 – 2023-04-25 (×3): qty 2, 28d supply, fill #0
  Filled 2023-05-22: qty 2, 28d supply, fill #1

## 2023-04-19 MED ORDER — AMOXICILLIN 500 MG PO CAPS
500.0000 mg | ORAL_CAPSULE | Freq: Three times a day (TID) | ORAL | 0 refills | Status: DC
  Filled 2023-04-19: qty 21, 7d supply, fill #0

## 2023-04-22 ENCOUNTER — Other Ambulatory Visit: Payer: Self-pay

## 2023-04-25 ENCOUNTER — Other Ambulatory Visit: Payer: Self-pay

## 2023-04-29 ENCOUNTER — Other Ambulatory Visit: Payer: Self-pay

## 2023-05-13 ENCOUNTER — Other Ambulatory Visit: Payer: Self-pay

## 2023-05-17 ENCOUNTER — Other Ambulatory Visit: Payer: Self-pay

## 2023-05-22 ENCOUNTER — Other Ambulatory Visit: Payer: Self-pay

## 2023-07-01 ENCOUNTER — Other Ambulatory Visit: Payer: Self-pay

## 2023-07-01 MED ORDER — MOUNJARO 5 MG/0.5ML ~~LOC~~ SOAJ
5.0000 mg | SUBCUTANEOUS | 1 refills | Status: DC
  Filled 2023-07-01: qty 2, 28d supply, fill #0
  Filled 2023-07-27: qty 2, 28d supply, fill #1

## 2023-07-02 ENCOUNTER — Other Ambulatory Visit: Payer: Self-pay

## 2023-07-09 ENCOUNTER — Encounter (HOSPITAL_COMMUNITY): Payer: Self-pay

## 2023-07-09 ENCOUNTER — Other Ambulatory Visit: Payer: Self-pay

## 2023-07-09 ENCOUNTER — Ambulatory Visit (HOSPITAL_COMMUNITY)
Admission: RE | Admit: 2023-07-09 | Discharge: 2023-07-09 | Disposition: A | Discharge status: Home / Self Care | Payer: Self-pay | Source: Ambulatory Visit | Attending: Emergency Medicine | Admitting: Emergency Medicine

## 2023-07-09 ENCOUNTER — Telehealth (HOSPITAL_COMMUNITY): Payer: Self-pay | Admitting: Emergency Medicine

## 2023-07-09 VITALS — BP 133/84 | HR 92 | Temp 98.0°F | Resp 17

## 2023-07-09 DIAGNOSIS — J0100 Acute maxillary sinusitis, unspecified: Secondary | ICD-10-CM

## 2023-07-09 DIAGNOSIS — M5441 Lumbago with sciatica, right side: Secondary | ICD-10-CM | POA: Diagnosis not present

## 2023-07-09 MED ORDER — AMOXICILLIN-POT CLAVULANATE 500-125 MG PO TABS: 1.0000 | ORAL_TABLET | Freq: Three times a day (TID) | ORAL | 0 refills | Status: AC

## 2023-07-09 MED ORDER — DEXAMETHASONE SODIUM PHOSPHATE 10 MG/ML IJ SOLN
10.0000 mg | Freq: Once | INTRAMUSCULAR | Status: AC
  Administered 2023-07-09: 10 mg via INTRAMUSCULAR

## 2023-07-09 MED ORDER — METHOCARBAMOL 500 MG PO TABS: 500.0000 mg | ORAL_TABLET | Freq: Two times a day (BID) | ORAL | 0 refills | Status: AC

## 2023-07-09 MED ORDER — DEXAMETHASONE SODIUM PHOSPHATE 10 MG/ML IJ SOLN
INTRAMUSCULAR | Status: AC
  Filled 2023-07-09: qty 1

## 2023-07-09 MED ORDER — AMOXICILLIN-POT CLAVULANATE 500-125 MG PO TABS
1.0000 | ORAL_TABLET | Freq: Three times a day (TID) | ORAL | 0 refills | Status: DC
  Filled 2023-07-09: qty 21, 7d supply, fill #0

## 2023-07-09 MED ORDER — METHOCARBAMOL 500 MG PO TABS
500.0000 mg | ORAL_TABLET | Freq: Two times a day (BID) | ORAL | 0 refills | Status: DC
  Filled 2023-07-09: qty 20, 10d supply, fill #0

## 2023-07-09 NOTE — Discharge Instructions (Signed)
 Start taking Augmentin twice daily for 7 days for sinus infection.   I recommend Mucinex as needed for congestion.  You are given an injection of Decadron today which is a steroid to help with your inflammation causing your back pain.  I also prescribed Robaxin which she can take twice daily as needed for muscle pain and spasms.    Otherwise alternate between Tylenol and ibuprofen as needed for pain.  You can also apply heat and do gentle stretching to help with the pain.  Return here if symptoms persist or worsen.

## 2023-07-09 NOTE — ED Triage Notes (Signed)
 Pt reports since Wed last week having headaches, nasal congestion and sinus pressure. Today c/o right lower back pain that radiates down right leg.

## 2023-07-09 NOTE — ED Provider Notes (Signed)
 [facility]    CSN: [id number] Arrival date & time: 07/09/23  1629      History   Chief Complaint Chief Complaint  Patient presents with   Headache    Sinus headache with nose blood and green mucus - Entered by patient   Back Pain    HPI Margaret Gonzalez is a 63 y.o. female.   Patient presents with headache, sinus pain/pressure, and nasal congestion since 3/5.  Reports having thick green mucus from nose. Denies fever, fatigue, and cough.  Patient also presents with right lower back pain that radiates down her right leg with certain movements that began today.  Denies history of back pain.  Denies any known injury.  Denies numbness and tingling   Headache Associated symptoms: back pain   Back Pain Associated symptoms: headaches     Past Medical History:  Diagnosis Date   Arthritis    Depression    Headache(784.0)    Hypertension    Pre-diabetes     Patient Active Problem List   Diagnosis Date Noted   Adjustment disorder with mixed emotional features 07/20/2020   Anxiety 07/20/2020   Body mass index (BMI) 39.0-39.9, adult 07/20/2020   Fatty liver 07/20/2020   Hyperlipidemia 07/20/2020   Hypertension 07/20/2020   Iron deficiency anemia 07/20/2020   Recurrent major depression (HCC) 07/20/2020   Type 2 diabetes mellitus with other specified complication (HCC) 07/20/2020   Closed displaced transverse fracture of shaft of left humerus 06/27/2020    Past Surgical History:  Procedure Laterality Date   CESAREAN SECTION  40,98,11   x3   CHOLECYSTECTOMY  1986   CLOSED REDUCTION HUMERUS FRACTURE Left 07/01/2020   Procedure: CLOSED REDUCTION HUMERAL SHAFT;  Surgeon: Vickki Hearing, MD;  Location: [facility];  Service: Orthopedics;  Laterality: Left;   COLONOSCOPY     KNEE ARTHROSCOPY Left 11/12/2012   Procedure: ARTHROSCOPY KNEE ;  Surgeon: Harvie Junior, MD;  Location: [facility];  Service: Orthopedics;  Laterality: Left;  partial medial and  lateral menisectomies and chrondroplasty of patella   KNEE ARTHROSCOPY Right    ORIF HUMERUS FRACTURE Left 07/26/2020   Procedure: OPEN REDUCTION INTERNAL FIXATION (ORIF) HUMERAL SHAFT FRACTURE WITH AUTOGRAFT;  Surgeon: Vickki Hearing, MD;  Location: [facility];  Service: Orthopedics;  Laterality: Left;    OB History   No obstetric history on file.      Home Medications    Prior to Admission medications   Medication Sig Start Date End Date Taking? Authorizing Provider  amoxicillin-clavulanate (AUGMENTIN) 500-125 MG tablet Take 1 tablet by mouth every 8 (eight) hours. 07/09/23  Yes Susann Givens, Rashmi Tallent A, NP  methocarbamol (ROBAXIN) 500 MG tablet Take 1 tablet (500 mg total) by mouth 2 (two) times daily. 07/09/23  Yes Susann Givens, Autie Vasudevan A, NP  amLODipine (NORVASC) 10 MG tablet Take 10 mg by mouth daily. 05/27/20   [provider]  amLODipine (NORVASC) 10 MG tablet 1 tablet by mouth Once a day 90 days 10/22/21     amLODipine (NORVASC) 10 MG tablet 1 tablet by mouth Once a day 90 days 03/06/22     amLODipine (NORVASC) 10 MG tablet Take 1 tablet (10 mg total) by mouth daily. 06/25/22     amLODipine (NORVASC) 10 MG tablet Take 1 tablet (10 mg total) by mouth daily. 02/26/23     aspirin EC 325 MG tablet Take 1 tablet (325 mg total) by mouth 2 (two) times daily for 1 month post op  to decrease risk of blood clots. 09/13/22     aspirin EC 81 MG tablet Take 81 mg by mouth daily. Swallow whole.    [provider]  B Complex Vitamins (VITAMIN B COMPLEX) TABS Take 1 tablet by mouth daily.    [provider]  benzonatate (TESSALON) 100 MG capsule Take 1 capsule (100 mg total) by mouth 3 (three) times daily as needed for cough. Do not take with alcohol or while driving or operating heavy machinery.  May cause drowsiness. 03/14/22   Valentino Nose, NP  buPROPion (WELLBUTRIN XL) 150 MG 24 hr tablet Take 150 mg by mouth daily. 03/28/20   [provider]  buPROPion (WELLBUTRIN  XL) 150 MG 24 hr tablet 1 tablet by mouth every morning 90 days 10/22/21     buPROPion (WELLBUTRIN XL) 150 MG 24 hr tablet Take 1 tablet by mouth every morning 90 days 03/06/22     buPROPion (WELLBUTRIN XL) 150 MG 24 hr tablet Take 1 tablet (150 mg total) by mouth every morning. 06/25/22     celecoxib (CELEBREX) 200 MG capsule Take 1 capsule (200 mg total) by mouth 2 (two) times daily. 09/13/22     citalopram (CELEXA) 20 MG tablet Take 20 mg by mouth daily.     [provider]  citalopram (CELEXA) 20 MG tablet 1 tablet by mouth Once a day 90 days 10/22/21     citalopram (CELEXA) 20 MG tablet Take 1 tablet by mouth Once a day 90 days 03/06/22     citalopram (CELEXA) 20 MG tablet Take 1 tablet (20 mg total) by mouth daily. 06/25/22     citalopram (CELEXA) 20 MG tablet Take 1 tablet (20 mg total) by mouth daily. 02/26/23     clonazePAM (KLONOPIN) 1 MG tablet Take 0.5 tablets (0.5 mg total) by mouth daily as needed. 10/23/21     diphenhydrAMINE (BENADRYL) 25 mg capsule Take 1 capsule (25 mg total) by mouth every 4 (four) hours as needed. 06/22/20   Vickki Hearing, MD  docusate sodium (COLACE) 100 MG capsule Take 1 capsule (100 mg total) by mouth 2 (two) times daily to prevent constipation. 09/13/22     fish oil-omega-3 fatty acids 1000 MG capsule Take 2 g by mouth daily.    [provider]  HYDROcodone-acetaminophen (NORCO) 7.5-325 MG tablet Take 1 tablet by mouth every 6 (six) hours as needed for moderate pain. 09/15/20   Vickki Hearing, MD  HYDROcodone-acetaminophen (NORCO/VICODIN) 5-325 MG tablet TAKE 1-2 TABLETS BY MOUTH EVERY 6HRS AS NEEDED FOR PAIN 04/11/22     HYDROmorphone (DILAUDID) 2 MG tablet Take 1-2 tablets (2-4 mg total) by mouth every 6 (six) hours as needed for severe pain. 09/19/22     losartan-hydrochlorothiazide (HYZAAR) 100-25 MG tablet Take 1 tablet by mouth daily.    [provider]  losartan-hydrochlorothiazide (HYZAAR) 100-25 MG tablet 1 tablet by mouth  Once a day 90 days 10/22/21     losartan-hydrochlorothiazide (HYZAAR) 100-25 MG tablet 1 tablet by mouth Once a day 90 days 03/06/22     losartan-hydrochlorothiazide (HYZAAR) 100-25 MG tablet Take 1 tablet by mouth daily. 06/25/22     losartan-hydrochlorothiazide (HYZAAR) 100-25 MG tablet Take 1 tablet by mouth daily. 02/26/23     meloxicam (MOBIC) 15 MG tablet Take 1 tablet by mouth daily with food 02/14/23     Multiple Vitamins-Minerals (VITAMIN D3 COMPLETE) TABS Take 1 tablet by mouth daily.    [provider]  omeprazole (PRILOSEC) 20  MG capsule Take 20 mg by mouth daily.    [provider]  ondansetron (ZOFRAN ODT) 4 MG disintegrating tablet Take 1 tablet (4 mg total) by mouth every 8 (eight) hours as needed for nausea or vomiting. Patient not taking: Reported on 10/20/2020 06/21/20   Gailen Shelter, PA  oxyCODONE (OXY IR/ROXICODONE) 5 MG immediate release tablet Take 1-2 tablets (5-10 mg total) by mouth every 4 (four) hours as needed for pain. 09/13/22     promethazine-dextromethorphan (PROMETHAZINE-DM) 6.25-15 MG/5ML syrup Take 5 mLs by mouth 4 (four) times daily as needed. 03/16/22   [name], PA-C  Red Yeast Rice 600 MG CAPS Take 1,200 mg by mouth daily.    [provider]  Semaglutide,0.25 or 0.5MG /DOS, (OZEMPIC, 0.25 OR 0.5 MG/DOSE,) 2 MG/3ML SOPN Inject 0.5 mg into the skin once a week. 04/18/22     tirzepatide (MOUNJARO) 2.5 MG/0.5ML Pen Inject 2.5 mg into the skin once a week. 04/19/23     tirzepatide (MOUNJARO) 5 MG/0.5ML Pen Inject 5 mg into the skin once a week. 07/01/23       Family History No family history on file.  Social History Social History   Tobacco Use   Smoking status: Never   Smokeless tobacco: Never  Vaping Use   Vaping status: Never Used  Substance Use Topics   Alcohol use: Yes    Comment: rare   Drug use: No     Allergies   Codeine and Oxycodone   Review of Systems Review of Systems  Musculoskeletal:  Positive  for back pain.  Neurological:  Positive for headaches.   Per HPI  Physical Exam Triage Vital Signs ED Triage Vitals  Encounter Vitals Group     BP 07/09/23 1707 133/84     Systolic BP Percentile --      Diastolic BP Percentile --      Pulse Rate 07/09/23 1707 92     Resp 07/09/23 1707 17     Temp 07/09/23 1707 98 F (36.7 C)     Temp Source 07/09/23 1707 Oral     SpO2 07/09/23 1707 95 %     Weight --      Height --      Head Circumference --      Peak Flow --      Pain Score 07/09/23 1704 7     Pain Loc --      Pain Education --      Exclude from Growth Chart --    No data found.  Updated Vital Signs BP 133/84 (BP Location: Left Arm)   Pulse 92   Temp 98 F (36.7 C) (Oral)   Resp 17   SpO2 95%   Visual Acuity Right Eye Distance:   Left Eye Distance:   Bilateral Distance:    Right Eye Near:   Left Eye Near:    Bilateral Near:     Physical Exam Vitals and nursing note reviewed.  Constitutional:      General: She is awake. She is not in acute distress.    Appearance: Normal appearance. She is well-developed and well-groomed. She is not ill-appearing.  HENT:     Right Ear: Tympanic membrane, ear canal and external ear normal.     Left Ear: Tympanic membrane, ear canal and external ear normal.     Nose: Congestion and rhinorrhea present.     Right Sinus: Maxillary sinus tenderness present. No frontal sinus tenderness.     Left Sinus:  Maxillary sinus tenderness present. No frontal sinus tenderness.     Mouth/Throat:     Mouth: Mucous membranes are moist.     Pharynx: Posterior oropharyngeal erythema present. No oropharyngeal exudate.  Cardiovascular:     Rate and Rhythm: Normal rate and regular rhythm.  Pulmonary:     Effort: Pulmonary effort is normal.     Breath sounds: Normal breath sounds.  Musculoskeletal:     Cervical back: Normal.     Thoracic back: Normal.     Lumbar back: Tenderness present. No bony tenderness. Normal range of motion. Positive  right straight leg raise test.     Comments: Tenderness noted to right low back.  Skin:    General: Skin is warm and dry.  Neurological:     Mental Status: She is alert.  Psychiatric:        Behavior: Behavior is cooperative.      UC Treatments / Results  Labs (all labs ordered are listed, but only abnormal results are displayed) Labs Reviewed - No data to display  EKG   Radiology No results found.  Procedures Procedures (including critical care time)  Medications Ordered in UC Medications  dexamethasone (DECADRON) injection 10 mg (has no administration in time range)    Initial Impression / Assessment and Plan / UC Course  I have reviewed the triage vital signs and the nursing notes.  Pertinent labs & imaging results that were available during my care of the patient were reviewed by me and considered in my medical decision making (see chart for details).     Upon assessment congestion and rhinorrhea present, bilateral maxillary sinus tenderness noted.  Prescribed Augmentin for sinus infection.  Recommended over-the-counter medication for symptoms.   Also has tenderness noted to right lower back and positive right straight leg test.  Given Decadron injection to help decrease inflammation causing pain.  Prescribed Robaxin as needed for muscle pain and spasms.  Discussed return precautions. Final Clinical Impressions(s) / UC Diagnoses   Final diagnoses:  Acute non-recurrent maxillary sinusitis  Acute right-sided low back pain with right-sided sciatica     Discharge Instructions      Start taking Augmentin twice daily for 7 days for sinus infection.   I recommend Mucinex as needed for congestion.  You are given an injection of Decadron today which is a steroid to help with your inflammation causing your back pain.  I also prescribed Robaxin which she can take twice daily as needed for muscle pain and spasms.    Otherwise alternate between Tylenol and  ibuprofen as needed for pain.  You can also apply heat and do gentle stretching to help with the pain.  Return here if symptoms persist or worsen.     ED Prescriptions     Medication Sig Dispense Auth. Provider   amoxicillin-clavulanate (AUGMENTIN) 500-125 MG tablet Take 1 tablet by mouth every 8 (eight) hours. 21 tablet Susann Givens, Shaneal Barasch A, NP   methocarbamol (ROBAXIN) 500 MG tablet Take 1 tablet (500 mg total) by mouth 2 (two) times daily. 20 tablet Wynonia Lawman A, NP      PDMP not reviewed this encounter.   Wynonia Lawman A, NP 07/09/23 [phone]

## 2023-08-01 ENCOUNTER — Other Ambulatory Visit: Payer: Self-pay

## 2023-08-22 ENCOUNTER — Other Ambulatory Visit: Payer: Self-pay

## 2023-08-22 MED ORDER — MELOXICAM 15 MG PO TABS
15.0000 mg | ORAL_TABLET | Freq: Every day | ORAL | 1 refills | Status: AC
  Filled 2023-08-22: qty 30, 30d supply, fill #0

## 2023-08-22 MED ORDER — AMOXICILLIN 500 MG PO CAPS
2000.0000 mg | ORAL_CAPSULE | ORAL | 0 refills | Status: AC
  Filled 2023-08-22: qty 20, 5d supply, fill #0

## 2023-08-23 ENCOUNTER — Other Ambulatory Visit: Payer: Self-pay

## 2023-08-24 ENCOUNTER — Other Ambulatory Visit: Payer: Self-pay

## 2023-08-27 ENCOUNTER — Other Ambulatory Visit: Payer: Self-pay

## 2023-08-27 DIAGNOSIS — J3089 Other allergic rhinitis: Secondary | ICD-10-CM | POA: Diagnosis not present

## 2023-08-27 DIAGNOSIS — I10 Essential (primary) hypertension: Secondary | ICD-10-CM | POA: Diagnosis not present

## 2023-08-27 DIAGNOSIS — E538 Deficiency of other specified B group vitamins: Secondary | ICD-10-CM | POA: Diagnosis not present

## 2023-08-27 DIAGNOSIS — J329 Chronic sinusitis, unspecified: Secondary | ICD-10-CM | POA: Diagnosis not present

## 2023-08-27 DIAGNOSIS — E1169 Type 2 diabetes mellitus with other specified complication: Secondary | ICD-10-CM | POA: Diagnosis not present

## 2023-08-27 DIAGNOSIS — E559 Vitamin D deficiency, unspecified: Secondary | ICD-10-CM | POA: Diagnosis not present

## 2023-08-27 DIAGNOSIS — R5383 Other fatigue: Secondary | ICD-10-CM | POA: Diagnosis not present

## 2023-08-27 DIAGNOSIS — F339 Major depressive disorder, recurrent, unspecified: Secondary | ICD-10-CM | POA: Diagnosis not present

## 2023-08-27 DIAGNOSIS — E782 Mixed hyperlipidemia: Secondary | ICD-10-CM | POA: Diagnosis not present

## 2023-08-27 MED ORDER — AMOXICILLIN-POT CLAVULANATE 875-125 MG PO TABS
1.0000 | ORAL_TABLET | Freq: Two times a day (BID) | ORAL | 0 refills | Status: AC
  Filled 2023-08-27: qty 14, 7d supply, fill #0

## 2023-08-27 MED ORDER — FLUTICASONE PROPIONATE 50 MCG/ACT NA SUSP
1.0000 | Freq: Two times a day (BID) | NASAL | 0 refills | Status: DC
  Filled 2023-08-27: qty 48, 90d supply, fill #0

## 2023-08-28 ENCOUNTER — Other Ambulatory Visit: Payer: Self-pay

## 2023-08-29 ENCOUNTER — Other Ambulatory Visit: Payer: Self-pay

## 2023-08-29 MED ORDER — MOUNJARO 5 MG/0.5ML ~~LOC~~ SOAJ
SUBCUTANEOUS | 3 refills | Status: DC
  Filled 2023-08-29: qty 2, 28d supply, fill #0
  Filled 2023-09-23: qty 2, 28d supply, fill #1
  Filled 2023-10-20: qty 2, 28d supply, fill #2
  Filled 2024-02-17: qty 2, 28d supply, fill #3

## 2023-08-30 ENCOUNTER — Other Ambulatory Visit: Payer: Self-pay

## 2023-09-26 ENCOUNTER — Other Ambulatory Visit: Payer: Self-pay

## 2023-10-23 ENCOUNTER — Other Ambulatory Visit: Payer: Self-pay

## 2023-10-24 ENCOUNTER — Other Ambulatory Visit: Payer: Self-pay

## 2023-10-24 MED ORDER — CITALOPRAM HYDROBROMIDE 20 MG PO TABS
20.0000 mg | ORAL_TABLET | Freq: Every day | ORAL | 1 refills | Status: DC
  Filled 2023-10-24: qty 90, 90d supply, fill #0
  Filled 2024-02-06: qty 90, 90d supply, fill #1

## 2023-10-24 MED ORDER — LOSARTAN POTASSIUM-HCTZ 100-25 MG PO TABS
1.0000 | ORAL_TABLET | Freq: Every day | ORAL | 1 refills | Status: DC
  Filled 2023-10-24: qty 90, 90d supply, fill #0
  Filled 2024-02-06: qty 90, 90d supply, fill #1

## 2023-10-30 ENCOUNTER — Other Ambulatory Visit: Payer: Self-pay

## 2023-11-20 ENCOUNTER — Other Ambulatory Visit: Payer: Self-pay

## 2023-11-20 MED ORDER — MOUNJARO 5 MG/0.5ML ~~LOC~~ SOAJ
5.0000 mg | SUBCUTANEOUS | 2 refills | Status: AC
  Filled 2023-11-20: qty 2, 28d supply, fill #0
  Filled 2023-12-23: qty 2, 28d supply, fill #1
  Filled 2024-01-16: qty 2, 28d supply, fill #2

## 2023-11-20 MED ORDER — AMLODIPINE BESYLATE 10 MG PO TABS
10.0000 mg | ORAL_TABLET | Freq: Every day | ORAL | 0 refills | Status: DC
  Filled 2023-11-20: qty 90, 90d supply, fill #0

## 2023-11-21 ENCOUNTER — Other Ambulatory Visit: Payer: Self-pay

## 2023-11-25 DIAGNOSIS — H353132 Nonexudative age-related macular degeneration, bilateral, intermediate dry stage: Secondary | ICD-10-CM | POA: Diagnosis not present

## 2023-11-25 DIAGNOSIS — H2513 Age-related nuclear cataract, bilateral: Secondary | ICD-10-CM | POA: Diagnosis not present

## 2023-11-25 DIAGNOSIS — H43823 Vitreomacular adhesion, bilateral: Secondary | ICD-10-CM | POA: Diagnosis not present

## 2023-11-25 DIAGNOSIS — H43813 Vitreous degeneration, bilateral: Secondary | ICD-10-CM | POA: Diagnosis not present

## 2023-12-23 ENCOUNTER — Other Ambulatory Visit: Payer: Self-pay

## 2024-01-16 ENCOUNTER — Other Ambulatory Visit: Payer: Self-pay

## 2024-01-22 ENCOUNTER — Other Ambulatory Visit: Payer: Self-pay

## 2024-01-27 ENCOUNTER — Other Ambulatory Visit: Payer: Self-pay

## 2024-02-06 ENCOUNTER — Other Ambulatory Visit: Payer: Self-pay

## 2024-02-07 ENCOUNTER — Ambulatory Visit: Payer: Self-pay

## 2024-02-07 ENCOUNTER — Ambulatory Visit (HOSPITAL_COMMUNITY): Admission: EM | Admit: 2024-02-07 | Discharge: 2024-02-07 | Disposition: A | Discharge status: Home / Self Care

## 2024-02-07 ENCOUNTER — Encounter (HOSPITAL_COMMUNITY): Payer: Self-pay

## 2024-02-07 DIAGNOSIS — J069 Acute upper respiratory infection, unspecified: Secondary | ICD-10-CM | POA: Diagnosis not present

## 2024-02-07 LAB — POC COVID19/FLU A&B COMBO
Covid Antigen, POC: NEGATIVE
Influenza A Antigen, POC: NEGATIVE
Influenza B Antigen, POC: NEGATIVE

## 2024-02-07 NOTE — ED Provider Notes (Signed)
 [facility]    CSN: [id number] Arrival date & time: 02/07/24  1642      History   Chief Complaint Chief Complaint  Patient presents with   Sore Throat   Headache   Facial Pain    HPI Margaret Gonzalez is a 63 y.o. female.   Pt presents today due to sinus pressure, cough, throat pain, and headache that started yesterday. Pt states that she took sudafed yesterday with relief and took again today and did not experience any relief. Pt denies fever, chills, nausea, vomiting, or change in appetite. 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    Sore Throat Associated symptoms include headaches.  Headache   Past Medical History:  Diagnosis Date   Arthritis    Depression    Headache(784.0)    Hypertension    Pre-diabetes     Patient Active Problem List   Diagnosis Date Noted   Adjustment disorder with mixed emotional features 07/20/2020   Anxiety 07/20/2020   Body mass index (BMI) 39.0-39.9, adult 07/20/2020   Fatty liver 07/20/2020   Hyperlipidemia 07/20/2020   Hypertension 07/20/2020   Iron deficiency anemia 07/20/2020   Recurrent major depression 07/20/2020   Type 2 diabetes mellitus with other specified complication (HCC) 07/20/2020   Closed displaced transverse fracture of shaft of left humerus 06/27/2020    Past Surgical History:  Procedure Laterality Date   CESAREAN SECTION  16,12,08   x3   CHOLECYSTECTOMY  1986   CLOSED REDUCTION HUMERUS FRACTURE Left 07/01/2020   Procedure: CLOSED REDUCTION HUMERAL SHAFT;  Surgeon: Margrette Taft BRAVO, MD;  Location: [facility];  Service: Orthopedics;  Laterality: Left;   COLONOSCOPY     KNEE ARTHROSCOPY Left 11/12/2012   Procedure: ARTHROSCOPY KNEE ;  Surgeon: Norleen LITTIE Gavel, MD;  Location: [facility];  Service: Orthopedics;  Laterality: Left;  partial medial and lateral menisectomies and chrondroplasty of patella   KNEE ARTHROSCOPY Right    ORIF HUMERUS FRACTURE Left 07/26/2020    Procedure: OPEN REDUCTION INTERNAL FIXATION (ORIF) HUMERAL SHAFT FRACTURE WITH AUTOGRAFT;  Surgeon: Margrette Taft BRAVO, MD;  Location: [facility];  Service: Orthopedics;  Laterality: Left;    OB History   No obstetric history on file.      Home Medications    Prior to Admission medications   Medication Sig Start Date End Date Taking? Authorizing Provider  amLODipine  (NORVASC ) 10 MG tablet Take 10 mg by mouth daily. 05/27/20  Yes [provider]  amLODipine  (NORVASC ) 10 MG tablet 1 tablet by mouth Once a day 90 days 03/06/22  Yes   amLODipine  (NORVASC ) 10 MG tablet Take 1 tablet (10 mg total) by mouth daily. 06/25/22  Yes   aspirin  EC 81 MG tablet Take 81 mg by mouth daily. Swallow whole.   Yes [provider]  B Complex Vitamins (VITAMIN B COMPLEX) TABS Take 1 tablet by mouth daily.   Yes [provider]  buPROPion  (WELLBUTRIN  XL) 150 MG 24 hr tablet Take 150 mg by mouth daily. 03/28/20  Yes [provider]  buPROPion  (WELLBUTRIN  XL) 150 MG 24 hr tablet 1 tablet by mouth every morning 90 days 10/22/21  Yes   buPROPion  (WELLBUTRIN  XL) 150 MG 24 hr tablet Take 1 tablet by mouth every morning 90 days 03/06/22  Yes   buPROPion  (WELLBUTRIN  XL) 150 MG 24 hr tablet Take 1 tablet (150 mg total) by mouth every morning. 06/25/22  Yes  citalopram  (CELEXA ) 20 MG tablet 1 tablet by mouth Once a day 90 days 10/22/21  Yes   citalopram  (CELEXA ) 20 MG tablet Take 1 tablet (20 mg total) by mouth daily. 10/24/23  Yes   clonazePAM  (KLONOPIN ) 1 MG tablet Take 0.5 tablets (0.5 mg total) by mouth daily as needed. 10/23/21  Yes   fish oil-omega-3 fatty acids 1000 MG capsule Take 2 g by mouth daily.   Yes [provider]  meloxicam  (MOBIC ) 15 MG tablet Take 1 tablet (15 mg total) by mouth daily with food. 08/22/23  Yes   Multiple Vitamins-Minerals (VITAMIN D3 COMPLETE) TABS Take 1 tablet by mouth daily.   Yes [provider]  omeprazole (PRILOSEC) 20 MG capsule Take 20 mg  by mouth daily.   Yes [provider]  Red Yeast Rice 600 MG CAPS Take 1,200 mg by mouth daily.   Yes [provider]  Semaglutide ,0.25 or 0.5MG /DOS, (OZEMPIC , 0.25 OR 0.5 MG/DOSE,) 2 MG/3ML SOPN Inject 0.5 mg into the skin once a week. 04/18/22  Yes   tirzepatide  (MOUNJARO ) 2.5 MG/0.5ML Pen Inject 2.5 mg into the skin once a week. 04/19/23  Yes   tirzepatide  (MOUNJARO ) 5 MG/0.5ML Pen Inject 5 mg into the skin once a week. 08/29/23  Yes   tirzepatide  (MOUNJARO ) 5 MG/0.5ML Pen Inject 5 mg into the skin once a week. 11/20/23  Yes   amLODipine  (NORVASC ) 10 MG tablet 1 tablet by mouth Once a day 90 days 10/22/21     amLODipine  (NORVASC ) 10 MG tablet Take 1 tablet (10 mg total) by mouth daily. 02/26/23     amLODipine  (NORVASC ) 10 MG tablet Take 1 tablet (10 mg total) by mouth daily. 11/20/23     amoxicillin  (AMOXIL ) 500 MG capsule Take 4 capsules (2,000 mg total) by mouth 1 hour prior to dental procedure. 08/22/23     amoxicillin -clavulanate (AUGMENTIN ) 500-125 MG tablet Take 1 tablet by mouth every 8 (eight) hours. 07/09/23   Johnie Flaming A, NP  amoxicillin -clavulanate (AUGMENTIN ) 875-125 MG tablet Take 1 tablet by mouth every 12 (twelve) hours FOR 7 DAYS. 08/27/23     aspirin  EC 325 MG tablet Take 1 tablet (325 mg total) by mouth 2 (two) times daily for 1 month post op to decrease risk of blood clots. 09/13/22     benzonatate  (TESSALON ) 100 MG capsule Take 1 capsule (100 mg total) by mouth 3 (three) times daily as needed for cough. Do not take with alcohol or while driving or operating heavy machinery.  May cause drowsiness. 03/14/22   Chandra Harlene LABOR, NP  celecoxib  (CELEBREX ) 200 MG capsule Take 1 capsule (200 mg total) by mouth 2 (two) times daily. 09/13/22     citalopram  (CELEXA ) 20 MG tablet Take 20 mg by mouth daily.     [provider]  citalopram  (CELEXA ) 20 MG tablet Take 1 tablet by mouth Once a day 90 days 03/06/22     citalopram  (CELEXA ) 20 MG tablet Take 1 tablet (20  mg total) by mouth daily. 06/25/22     diphenhydrAMINE  (BENADRYL ) 25 mg capsule Take 1 capsule (25 mg total) by mouth every 4 (four) hours as needed. 06/22/20   Margrette Taft BRAVO, MD  docusate sodium  (COLACE) 100 MG capsule Take 1 capsule (100 mg total) by mouth 2 (two) times daily to prevent constipation. 09/13/22     fluticasone  (FLONASE ) 50 MCG/ACT nasal spray instill 1 spray into each nostril twice a day 08/27/23     HYDROcodone -acetaminophen  (NORCO) 7.5-325 MG  tablet Take 1 tablet by mouth every 6 (six) hours as needed for moderate pain. 09/15/20   Margrette Taft BRAVO, MD  HYDROcodone -acetaminophen  (NORCO/VICODIN) 5-325 MG tablet TAKE 1-2 TABLETS BY MOUTH EVERY 6HRS AS NEEDED FOR PAIN 04/11/22     HYDROmorphone  (DILAUDID ) 2 MG tablet Take 1-2 tablets (2-4 mg total) by mouth every 6 (six) hours as needed for severe pain. 09/19/22     losartan -hydrochlorothiazide  (HYZAAR ) 100-25 MG tablet Take 1 tablet by mouth daily.    [provider]  losartan -hydrochlorothiazide  (HYZAAR ) 100-25 MG tablet 1 tablet by mouth Once a day 90 days 10/22/21     losartan -hydrochlorothiazide  (HYZAAR ) 100-25 MG tablet 1 tablet by mouth Once a day 90 days 03/06/22     losartan -hydrochlorothiazide  (HYZAAR ) 100-25 MG tablet Take 1 tablet by mouth daily. 06/25/22     losartan -hydrochlorothiazide  (HYZAAR ) 100-25 MG tablet Take 1 tablet by mouth daily. 10/24/23     methocarbamol  (ROBAXIN ) 500 MG tablet Take 1 tablet (500 mg total) by mouth 2 (two) times daily. 07/09/23   Johnie Flaming A, NP  ondansetron  (ZOFRAN  ODT) 4 MG disintegrating tablet Take 1 tablet (4 mg total) by mouth every 8 (eight) hours as needed for nausea or vomiting. Patient not taking: Reported on 10/20/2020 06/21/20   Neldon Hamp RAMAN, PA  oxyCODONE  (OXY IR/ROXICODONE ) 5 MG immediate release tablet Take 1-2 tablets (5-10 mg total) by mouth every 4 (four) hours as needed for pain. 09/13/22     promethazine -dextromethorphan (PROMETHAZINE -DM) 6.25-15 MG/5ML  syrup Take 5 mLs by mouth 4 (four) times daily as needed. 03/16/22   Vivienne Delon HERO, PA-C    Family History History reviewed. No pertinent family history.  Social History Social History   Tobacco Use   Smoking status: Never   Smokeless tobacco: Never  Vaping Use   Vaping status: Never Used  Substance Use Topics   Alcohol use: Yes    Comment: rare   Drug use: No     Allergies   Codeine and Oxycodone    Review of Systems Review of Systems  Neurological:  Positive for headaches.     Physical Exam Triage Vital Signs ED Triage Vitals [02/07/24 1744]  Encounter Vitals Group     BP 125/70     Girls Systolic BP Percentile      Girls Diastolic BP Percentile      Boys Systolic BP Percentile      Boys Diastolic BP Percentile      Pulse Rate 78     Resp 18     Temp 98.4 F (36.9 C)     Temp Source Oral     SpO2 97 %     Weight      Height      Head Circumference      Peak Flow      Pain Score      Pain Loc      Pain Education      Exclude from Growth Chart    No data found.  Updated Vital Signs BP 125/70 (BP Location: Left Arm)   Pulse 78   Temp 98.4 F (36.9 C) (Oral)   Resp 18   SpO2 97%   Visual Acuity Right Eye Distance:   Left Eye Distance:   Bilateral Distance:    Right Eye Near:   Left Eye Near:    Bilateral Near:     Physical Exam Vitals and nursing note reviewed.  Constitutional:      General: She is not in  acute distress.    Appearance: Normal appearance. She is not ill-appearing, toxic-appearing or diaphoretic.  HENT:     Nose: No congestion or rhinorrhea.     Mouth/Throat:     Mouth: Mucous membranes are moist.     Pharynx: Oropharynx is clear. No oropharyngeal exudate or posterior oropharyngeal erythema.  Eyes:     General: No scleral icterus. Cardiovascular:     Rate and Rhythm: Normal rate and regular rhythm.     Heart sounds: Normal heart sounds.  Pulmonary:     Effort: Pulmonary effort is normal. No respiratory  distress.     Breath sounds: Normal breath sounds. No wheezing or rhonchi.  Musculoskeletal:     Cervical back: Tenderness present.  Lymphadenopathy:     Cervical: No cervical adenopathy.  Skin:    General: Skin is warm.  Neurological:     Mental Status: She is alert and oriented to person, place, and time.  Psychiatric:        Mood and Affect: Mood normal.        Behavior: Behavior normal.      UC Treatments / Results  Labs (all labs ordered are listed, but only abnormal results are displayed) Labs Reviewed  POC COVID19/FLU A&B COMBO    EKG   Radiology No results found.  Procedures Procedures (including critical care time)  Medications Ordered in UC Medications - No data to display  Initial Impression / Assessment and Plan / UC Course  I have reviewed the triage vital signs and the nursing notes.  Pertinent labs & imaging results that were available during my care of the patient were reviewed by me and considered in my medical decision making (see chart for details).     Viral URI- physical exam unremarkable, poct testing in office neg, symptoms most likely due to respiratory virus. Pt given suggestions for supportive treatment.  Final Clinical Impressions(s) / UC Diagnoses   Final diagnoses:  Viral URI     Discharge Instructions      You been diagnosed with a viral illness today. -Viruses have to run their course and medicines that are prescribed are meant to help with symptoms. - With viruses usually feel poorly from 3 to 7 days with cough being the last symptoms to resolve.  -Cough can linger from days to weeks.  Antibiotics are not effective for viruses. -If your cough lasts more than 2 weeks and you are coughing so hard that you are vomiting or feel like you could pass out we need to follow-up with PCP for further testing and evaluation. -Rest, increase water intake, may use pseudoephedrine for nasal congestion, Delsym (dextromethorphan) or honey as  needed for cough, and ibuprofen  and/or Tylenol  as directed on packaging for pain and fever. -If you have hypertension you should take Coricidin or other OTC meds approved for people with high blood pressure. -You may use a spoonful of honey every 4-6 hours as needed for throat pain and cough. -Warm tea with honey and lemon are helpful for soothe throat as well.  Chloraseptic and Cepacol make a throat lozenge with numbing medication, can be purchased over-the-counter. -May also use Flonase  or sinus rinse for sinus pressure or nasal congestion.  Be sure to use distilled bottled water for sinus rinses. -May use coolmist humidifier to open up nasal passages -May elevate head to assist with postnasal drainage. -If you feel poorly (fever, fatigue, shortness of breath, nausea, etc.) for more than 10 days to be sure to follow-up with  PCP or in clinic for further evaluation and additional treatments. If you experience chest pain with shortness of breath or pulse oxygen less than 95% you should report to the ER.     ED Prescriptions   None    PDMP not reviewed this encounter.   Andra Corean BROCKS, PA-C 02/07/24 1817

## 2024-02-07 NOTE — Discharge Instructions (Signed)

## 2024-02-07 NOTE — ED Triage Notes (Signed)
 Patient present to the office for sinus pressure and headache that started yesterday. Today, patient c/o sore throat. Medication: None

## 2024-02-10 DIAGNOSIS — I10 Essential (primary) hypertension: Secondary | ICD-10-CM | POA: Diagnosis not present

## 2024-02-10 DIAGNOSIS — R0683 Snoring: Secondary | ICD-10-CM | POA: Diagnosis not present

## 2024-02-10 DIAGNOSIS — R0689 Other abnormalities of breathing: Secondary | ICD-10-CM | POA: Diagnosis not present

## 2024-02-11 ENCOUNTER — Other Ambulatory Visit: Payer: Self-pay

## 2024-02-11 MED ORDER — CLONAZEPAM 1 MG PO TABS
0.5000 mg | ORAL_TABLET | Freq: Every day | ORAL | 0 refills | Status: AC | PRN
  Filled 2024-02-11: qty 45, 90d supply, fill #0

## 2024-02-12 ENCOUNTER — Other Ambulatory Visit: Payer: Self-pay

## 2024-02-17 ENCOUNTER — Other Ambulatory Visit: Payer: Self-pay

## 2024-03-12 DIAGNOSIS — G4733 Obstructive sleep apnea (adult) (pediatric): Secondary | ICD-10-CM | POA: Diagnosis not present

## 2024-03-23 DIAGNOSIS — G4733 Obstructive sleep apnea (adult) (pediatric): Secondary | ICD-10-CM | POA: Diagnosis not present

## 2024-03-25 ENCOUNTER — Other Ambulatory Visit: Payer: Self-pay

## 2024-03-30 ENCOUNTER — Other Ambulatory Visit: Payer: Self-pay

## 2024-03-30 MED ORDER — MOUNJARO 5 MG/0.5ML ~~LOC~~ SOAJ
5.0000 mg | SUBCUTANEOUS | 0 refills | Status: AC
  Filled 2024-03-30: qty 2, 28d supply, fill #0

## 2024-04-01 ENCOUNTER — Other Ambulatory Visit: Payer: Self-pay

## 2024-04-02 ENCOUNTER — Other Ambulatory Visit: Payer: Self-pay

## 2024-04-02 DIAGNOSIS — F418 Other specified anxiety disorders: Secondary | ICD-10-CM | POA: Diagnosis not present

## 2024-04-02 DIAGNOSIS — Z0000 Encounter for general adult medical examination without abnormal findings: Secondary | ICD-10-CM | POA: Diagnosis not present

## 2024-04-02 DIAGNOSIS — F331 Major depressive disorder, recurrent, moderate: Secondary | ICD-10-CM | POA: Diagnosis not present

## 2024-04-02 DIAGNOSIS — I10 Essential (primary) hypertension: Secondary | ICD-10-CM | POA: Diagnosis not present

## 2024-04-02 DIAGNOSIS — G4733 Obstructive sleep apnea (adult) (pediatric): Secondary | ICD-10-CM | POA: Diagnosis not present

## 2024-04-02 DIAGNOSIS — R5383 Other fatigue: Secondary | ICD-10-CM | POA: Diagnosis not present

## 2024-04-02 DIAGNOSIS — E1169 Type 2 diabetes mellitus with other specified complication: Secondary | ICD-10-CM | POA: Diagnosis not present

## 2024-04-02 DIAGNOSIS — E678 Other specified hyperalimentation: Secondary | ICD-10-CM | POA: Diagnosis not present

## 2024-04-02 DIAGNOSIS — E782 Mixed hyperlipidemia: Secondary | ICD-10-CM | POA: Diagnosis not present

## 2024-04-02 MED ORDER — BUPROPION HCL ER (XL) 150 MG PO TB24
150.0000 mg | ORAL_TABLET | Freq: Every day | ORAL | 1 refills | Status: AC
  Filled 2024-04-02 – 2024-04-13 (×2): qty 90, 90d supply, fill #0

## 2024-04-02 MED ORDER — MOUNJARO 7.5 MG/0.5ML ~~LOC~~ SOAJ
7.5000 mg | SUBCUTANEOUS | 3 refills | Status: AC
  Filled 2024-04-02 – 2024-04-22 (×3): qty 2, 28d supply, fill #0
  Filled 2024-05-19: qty 2, 28d supply, fill #1

## 2024-04-05 DIAGNOSIS — G4733 Obstructive sleep apnea (adult) (pediatric): Secondary | ICD-10-CM | POA: Diagnosis not present

## 2024-04-07 DIAGNOSIS — R051 Acute cough: Secondary | ICD-10-CM | POA: Diagnosis not present

## 2024-04-07 DIAGNOSIS — J069 Acute upper respiratory infection, unspecified: Secondary | ICD-10-CM | POA: Diagnosis not present

## 2024-04-08 ENCOUNTER — Other Ambulatory Visit: Payer: Self-pay

## 2024-04-13 ENCOUNTER — Other Ambulatory Visit: Payer: Self-pay

## 2024-04-21 ENCOUNTER — Other Ambulatory Visit: Payer: Self-pay

## 2024-04-22 ENCOUNTER — Other Ambulatory Visit: Payer: Self-pay

## 2024-04-24 ENCOUNTER — Other Ambulatory Visit: Payer: Self-pay

## 2024-05-06 ENCOUNTER — Other Ambulatory Visit: Payer: Self-pay

## 2024-05-07 ENCOUNTER — Other Ambulatory Visit: Payer: Self-pay

## 2024-05-07 ENCOUNTER — Other Ambulatory Visit (HOSPITAL_COMMUNITY): Payer: Self-pay

## 2024-05-07 MED ORDER — LOSARTAN POTASSIUM-HCTZ 100-25 MG PO TABS
1.0000 | ORAL_TABLET | Freq: Every day | ORAL | 0 refills | Status: AC
  Filled 2024-05-07: qty 90, 90d supply, fill #0

## 2024-05-07 MED ORDER — CITALOPRAM HYDROBROMIDE 20 MG PO TABS
20.0000 mg | ORAL_TABLET | Freq: Every day | ORAL | 0 refills | Status: AC
  Filled 2024-05-07: qty 90, 90d supply, fill #0

## 2024-05-19 ENCOUNTER — Other Ambulatory Visit: Payer: Self-pay

## 2024-05-19 ENCOUNTER — Other Ambulatory Visit (HOSPITAL_COMMUNITY): Payer: Self-pay

## 2024-05-19 MED ORDER — AMLODIPINE BESYLATE 10 MG PO TABS
10.0000 mg | ORAL_TABLET | Freq: Every day | ORAL | 0 refills | Status: AC
  Filled 2024-05-19 (×2): qty 90, 90d supply, fill #0

## 2024-05-19 MED ORDER — FLUTICASONE PROPIONATE 50 MCG/ACT NA SUSP
NASAL | 0 refills | Status: AC
  Filled 2024-05-19 (×2): qty 48, 90d supply, fill #0

## 2024-05-20 ENCOUNTER — Other Ambulatory Visit: Payer: Self-pay
